# Patient Record
Sex: Male | Born: 2000 | Race: Black or African American | Hispanic: No | Marital: Single | Smoking: Never smoker
Health system: Southern US, Community
[De-identification: ages and names within clinical notes are randomized; demographics above are authoritative.]

## PROBLEM LIST (undated history)

## (undated) DIAGNOSIS — R6250 Unspecified lack of expected normal physiological development in childhood: Secondary | ICD-10-CM

## (undated) DIAGNOSIS — J45909 Unspecified asthma, uncomplicated: Secondary | ICD-10-CM

## (undated) DIAGNOSIS — L309 Dermatitis, unspecified: Secondary | ICD-10-CM

## (undated) DIAGNOSIS — E3431 Constitutional short stature: Secondary | ICD-10-CM

## (undated) DIAGNOSIS — J302 Other seasonal allergic rhinitis: Secondary | ICD-10-CM

## (undated) DIAGNOSIS — R6252 Short stature (child): Secondary | ICD-10-CM

## (undated) DIAGNOSIS — K4090 Unilateral inguinal hernia, without obstruction or gangrene, not specified as recurrent: Secondary | ICD-10-CM

## (undated) DIAGNOSIS — R011 Cardiac murmur, unspecified: Secondary | ICD-10-CM

## (undated) HISTORY — DX: Unspecified lack of expected normal physiological development in childhood: R62.50

## (undated) HISTORY — DX: Constitutional short stature: E34.31

## (undated) HISTORY — PX: ORCHIOPEXY: SHX479

---
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
## 2021-03-04 ENCOUNTER — Telehealth (INDEPENDENT_AMBULATORY_CARE_PROVIDER_SITE_OTHER): Payer: Self-pay | Admitting: Family

## 2021-03-04 NOTE — Telephone Encounter (Signed)
Who's calling (name and relationship to patient) : akhagboye Cozad mom   Best contact number: [phone]  Provider they see: Gretchen Short  Reason for call: Needs prior auth for nutropin   Call ID:      PRESCRIPTION REFILL ONLY  Name of prescription:  Pharmacy:

## 2021-03-05 NOTE — Telephone Encounter (Signed)
I will need to call [facility] for this.

## 2021-03-11 NOTE — Telephone Encounter (Signed)
Please follow up on this

## 2021-03-13 NOTE — Telephone Encounter (Signed)
Mom called this morning.She has reached out a couple of times about getting a prior auth for the patient and it hasn't been sent. Patient is out of supplies and is sharing with his twin but now he is goin to run our sooner. I see that is has been since the 20th I think. She wanted an update on it.Please Advise

## 2021-03-13 NOTE — Telephone Encounter (Signed)
PA has been sent for patient. PA submitted over the phone and chart notes sent in. Spoke with mom. She said Nettie has been using his brothers, [name]. She asked if we had any samples in office. I told her that I would ask [facility].

## 2021-03-16 NOTE — Telephone Encounter (Signed)
I received a denial for patients Nutropin. Patient doesn't meet the required criteria for medication.

## 2021-03-16 NOTE — Telephone Encounter (Signed)
Yes we can give samples if we have any in the fridge.

## 2021-03-16 NOTE — Telephone Encounter (Signed)
It should be appealed. Please speak with Marijean Niemann about this as she has dealt with this family specifically before. They usually decline then we have to do a PA.

## 2021-03-18 NOTE — Telephone Encounter (Signed)
Spoke with mom. Let her know that the [facility] was denied. I will appeal it. Also a sample is at the front for her. She is aware and will pick it up tomorrow.

## 2021-03-20 NOTE — Telephone Encounter (Signed)
Spoke with [facility]. Fax notes-labs-growth charts to show patiets height from last yr to this year. Tanner stage less than 4 Bone age is less than 16 measured in the past 12 months.   [phone]

## 2021-03-20 NOTE — Telephone Encounter (Signed)
Spoke with mom. The appeal need a current bone age scan with in a year of his last. I have asked mom if she could, go ahead and get the scan done. When I have the new scan I will send the appeal in.

## 2021-03-24 ENCOUNTER — Other Ambulatory Visit (INDEPENDENT_AMBULATORY_CARE_PROVIDER_SITE_OTHER): Payer: Self-pay

## 2021-03-24 ENCOUNTER — Ambulatory Visit
Admission: RE | Admit: 2021-03-24 | Discharge: 2021-03-24 | Disposition: A | Discharge status: Home / Self Care | Payer: 59 | Source: Ambulatory Visit | Attending: Family | Admitting: Family

## 2021-03-24 DIAGNOSIS — E230 Hypopituitarism: Secondary | ICD-10-CM

## 2021-04-03 ENCOUNTER — Other Ambulatory Visit: Payer: Self-pay

## 2021-04-03 ENCOUNTER — Encounter (INDEPENDENT_AMBULATORY_CARE_PROVIDER_SITE_OTHER): Payer: Self-pay | Admitting: Family

## 2021-04-03 ENCOUNTER — Ambulatory Visit (INDEPENDENT_AMBULATORY_CARE_PROVIDER_SITE_OTHER): Payer: 59 | Admitting: Family

## 2021-04-03 VITALS — BP 112/64 | HR 74 | Ht 64.84 in | Wt 115.2 lb

## 2021-04-03 DIAGNOSIS — M8580 Other specified disorders of bone density and structure, unspecified site: Secondary | ICD-10-CM

## 2021-04-03 DIAGNOSIS — E230 Hypopituitarism: Secondary | ICD-10-CM | POA: Diagnosis not present

## 2021-04-03 NOTE — Patient Instructions (Signed)
-   Stop growth hormone  - Repeat IGF-1 and Bp3 at 3 months and again at 6 months   It was a pleasure seeing you in clinic today. Please do not hesitate to contact me if you have questions or concerns.

## 2021-04-03 NOTE — Progress Notes (Signed)
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 [name] male   [name] was accompanied by his mother and twin brother   1. Sean [name] was seen by his PMD in 2012 for his annual well child check. The parents were concerned about poor eating, picky eating, and poor growth. They felt that Sean [name] was significantly shorter than other boys his age although he is about the same height as his twin brother. Both boys have had difficulty with getting picked on. They were seen in the fall of 2015 for their 13 year WCC. At that visit their pcp readdressed concerns about poor linear growth and advised them to have repeat bone ages and return to endocrinology. Bone ages were read as 10 years at 13 years 8 months. (Previous study 3 years ago was 7 years). This conveys a predicted adult height around 5'1".   2. His last [facility] visit was on 11/2020. In the interim, he has been generally healthy.   He will be graduating high school soon, preparing for test. He continues to take 2.1 mg of GH per day. He estimates he misses about 1 dose per week or less. HIs appetite has been good. He also takes 4 mg of cyproheptadine per day.    3. Pertinent Review of Systems:  All systems reviewed with pertinent positives listed below; otherwise negative. Constitutional: Sleeping well. 3 lbs weight loss.  Eyes; Wearing blue glasses. No changes in vision.  HENT: No neck pain. No difficulty swallowing.  Respiratory: No increased work of breathing currently Cardiac: no chest pain. No tachycardia.  GI: No constipation or diarrhea GU: puberty changes as above Musculoskeletal: No joint deformity Neuro: Normal affect Endocrine: As  above   PAST MEDICAL, FAMILY, AND SOCIAL HISTORY  Past Medical History:  Diagnosis Date  . Asthma    prn inhaler/neb.  . Constitutional growth delay   . Eczema   . Heart murmur    functional, per cardiologist 11/12/2015  . Inguinal hernia 11/2015  . Seasonal allergies   . Short stature for age   . Twin birth     Family History  Problem Relation Age of Onset  . Delayed puberty Father   . Thyroid disease Neg Hx      Current Outpatient Medications:  .  cyproheptadine (PERIACTIN) 4 MG tablet, Take 1 tablet (4 mg total) by mouth at bedtime., Disp: 30 tablet, Rfl: 5 .  NUTROPIN AQ NUSPIN 10 10 MG/2ML SOPN, INJECT 2MG  SUBCUTANEOUSLY  DAILY (DISCARD 28 DAYS  AFTER FIRST USE), Disp: 12 mL, Rfl: 1 .  albuterol (ACCUNEB) 0.63 MG/3ML nebulizer solution, Take 1 ampule by nebulization as needed. Reported on 03/31/2016 (Patient not taking: No sig reported), Disp: , Rfl:  .  albuterol (PROVENTIL HFA;VENTOLIN HFA) 108 (90 Base) MCG/ACT inhaler, Inhale into the lungs every 6 (six) hours as needed for wheezing or shortness of breath. Reported on 03/31/2016 (Patient not taking: No sig reported), Disp: , Rfl:  .  Somatropin (NUTROPIN AQ NUSPIN 10) 10 MG/2ML SOLN, INJECT 1.7MG  SUBCUTANEOUSLY 6 DAYS PER WEEK (DISCARD 28 DAYS AFTER FIRST USE) (Patient not taking: No sig reported), Disp: 8 mL, Rfl: 5  Allergies as of 04/03/2021  . (No Known  Allergies)     reports that he has never smoked. He has never used smokeless tobacco. He reports that he does not drink alcohol and does not use drugs. Pediatric History  Patient Parents  . Cashen,Akhagboye "Olu" (Mother)  . Bewley,Adegbenga N "Ade" (Father)   Other Topics Concern  . Not on file  Social History Narrative   11 th grade at [employer] Lives with parents twin brother and sister    1. School and Family: 12th grade at [employer]  2. Activities: Baseball, basketball and track  3. Primary Care Provider: Clovis Riley, L.August Saucer, MD  ROS: There are  no other significant problems involving Haidar's other body systems.    Objective:  Objective  Vital Signs:  BP 112/64 (BP Location: Right Arm, Patient Position: Sitting, Cuff Size: Normal)   Pulse 74   Ht 5' 4.84" (1.647 m)   Wt 115 lb 3.2 oz (52.3 kg)   BMI 19.26 kg/m   Blood pressure percentiles are not available for patients who are 18 years or older.  Ht Readings from Last 3 Encounters:  04/03/21 5' 4.84" (1.647 m) (5 %, Z= -1.66)*  12/04/20 5' 5.2" (1.656 m) (6 %, Z= -1.52)*  06/13/20 5' 4.53" (1.639 m) (4 %, Z= -1.72)*   * Growth percentiles are based on CDC (Boys, 2-20 Years) data.   Wt Readings from Last 3 Encounters:  04/03/21 115 lb 3.2 oz (52.3 kg) (2 %, Z= -2.06)*  12/04/20 118 lb 9.6 oz (53.8 kg) (4 %, Z= -1.76)*  06/13/20 111 lb 12.4 oz (50.7 kg) (2 %, Z= -2.14)*   * Growth percentiles are based on CDC (Boys, 2-20 Years) data.   HC Readings from Last 3 Encounters:  08/26/15 21.77" (55.3 cm) (72 %, Z= 0.59)*   * Growth percentiles are based on [facility] (Boys, 2-18 Years) data.   Body surface area is 1.55 meters squared. 5 %ile (Z= -1.66) based on CDC (Boys, 2-20 Years) Stature-for-age data based on Stature recorded on 04/03/2021. 2 %ile (Z= -2.06) based on CDC (Boys, 2-20 Years) weight-for-age data using vitals from 04/03/2021.    PHYSICAL EXAM:  General: Well developed, well nourished male in no acute distress.   Head: Normocephalic, atraumatic.   Eyes:  Pupils equal and round. EOMI.  Sclera white.  No eye drainage.   Ears/Nose/Mouth/Throat: Nares patent, no nasal drainage.  Normal dentition, mucous membranes moist.  Neck: supple, no cervical lymphadenopathy, no thyromegaly Cardiovascular: regular rate, normal S1/S2, no murmurs Respiratory: No increased work of breathing.  Lungs clear to auscultation bilaterally.  No wheezes. Abdomen: soft, nontender, nondistended. Normal bowel sounds.  No appreciable masses  Extremities: warm, well perfused, cap refill  < 2 sec.   Musculoskeletal: Normal muscle mass.  Normal strength Skin: warm, dry.  No rash or lesions. Neurologic: alert and oriented, normal speech, no tremor    LAB DATA:   No results found for this or any previous visit (from the past 672 hour(s)). pending    Assessment and Plan:  Assessment  ASSESSMENT:  Amaro is a 20 y.o. 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   Height growth has ceased which is consistent with fusion of bones on xray of hand. Will stop growth hormone injections today and monitor labs at 3 and 6 months.   PLAN:  1. Diagnostic: Reviewed bone age xray with family. Will repeat IGF 1 and BP3 in 3 months.  2. Therapeutic:Stop growth hormone.  3. Patient education: Reviewed growth chart. Discussed that  with fusion of bone on hand xray, height growth is likely complete. Will check labs at 3 months and 6 months to ensure they will not need GH supplementation as adults.   4. Follow-up: 4 months.      LOS: >30 spent today reviewing the medical chart, counseling the patient/family, and documenting today's visit.        Gretchen Short,  FNP-C  Pediatric Specialist  [address]  [place], [address]  Tele: [phone]

## 2021-04-24 ENCOUNTER — Encounter (HOSPITAL_COMMUNITY): Payer: Self-pay | Admitting: Emergency Medicine

## 2021-04-24 ENCOUNTER — Ambulatory Visit (HOSPITAL_COMMUNITY)
Admission: EM | Admit: 2021-04-24 | Discharge: 2021-04-24 | Disposition: A | Discharge status: Home / Self Care | Payer: 59 | Attending: Physician Assistant | Admitting: Physician Assistant

## 2021-04-24 DIAGNOSIS — E860 Dehydration: Secondary | ICD-10-CM | POA: Diagnosis not present

## 2021-04-24 DIAGNOSIS — R110 Nausea: Secondary | ICD-10-CM

## 2021-04-24 DIAGNOSIS — R42 Dizziness and giddiness: Secondary | ICD-10-CM

## 2021-04-24 DIAGNOSIS — R55 Syncope and collapse: Secondary | ICD-10-CM

## 2021-04-24 MED ORDER — ONDANSETRON 4 MG PO TBDP
4.0000 mg | ORAL_TABLET | Freq: Once | ORAL | Status: AC
  Administered 2021-04-24: 4 mg via ORAL

## 2021-04-24 MED ORDER — ONDANSETRON 4 MG PO TBDP: 4.0000 mg | ORAL_TABLET | Freq: Three times a day (TID) | ORAL | 0 refills | Status: DC | PRN

## 2021-04-24 MED ORDER — ONDANSETRON 4 MG PO TBDP
ORAL_TABLET | ORAL | Status: AC
  Filled 2021-04-24: qty 1

## 2021-04-24 NOTE — ED Triage Notes (Addendum)
Pt presents with dizziness, nausea, diarrhea and father states he collapsed this morning due to weakness. States Mother gave NyQuil last night. States symptoms started yesterday.   Per Verline Lema, PA okay for evaluation at [facility].

## 2021-04-24 NOTE — ED Provider Notes (Signed)
[facility]    CSN: [id number] Arrival date & time: 04/24/21  4098      History   Chief Complaint Chief Complaint  Patient presents with   Nausea   Dizziness   Near Syncope    HPI Sean [name] is a 20 y.o. male.   HPI  Weakness: Patient presents with his father and brother.  Patient has had nausea, dizziness, vomiting for the past 2 days.  He denies any vomiting and had 2 episodes of nonbloody diarrhea yesterday.  He had a near syncopal event this morning which they attribute to dehydration.  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  Due to his nausea yesterday he does not recall drinking much fluids.  He did not have anything to eat or drink this morning when he was sitting on the couch with his mom.  He got up quickly and started walking and he fell to the floor.  He remained conscious the entire time and denied any head injury.  They have given his Nyquil last night which did not help symptoms.  No known sick contacts.  Father states that they took a home COVID test this morning that was negative.  Past Medical History:  Diagnosis Date   Asthma    prn inhaler/neb.   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 Authorizing Provider  albuterol (ACCUNEB) 0.63 MG/3ML nebulizer solution Take 1 ampule by nebulization as needed. Reported on 03/31/2016 Patient not taking: No sig reported    [provider]  albuterol (PROVENTIL HFA;VENTOLIN HFA) 108 (90 Base) MCG/ACT inhaler Inhale into the lungs every 6 (six) hours as needed for wheezing or shortness of breath. Reported on 03/31/2016 Patient not taking: No sig reported    [provider]  cyproheptadine (PERIACTIN) 4 MG tablet Take 1 tablet (4 mg total) by mouth at bedtime. 08/14/20   Gretchen Short, NP  NUTROPIN AQ NUSPIN 10 10 MG/2ML SOPN INJECT 2MG  SUBCUTANEOUSLY  DAILY (DISCARD 28 DAYS  AFTER FIRST USE) 11/24/20   01/22/21, NP  Somatropin (NUTROPIN AQ NUSPIN 10) 10 MG/2ML SOLN INJECT 1.7MG  SUBCUTANEOUSLY 6 DAYS PER WEEK (DISCARD 28 DAYS AFTER FIRST USE) Patient not taking: No sig reported 07/30/19   08/01/19, NP    Family History Family History  Problem Relation Age of Onset   Delayed puberty Father    Thyroid disease Neg Hx     Social History Social History   Tobacco Use   Smoking status: Never   Smokeless tobacco: Never  Substance Use Topics   Alcohol  use: No   Drug use: No     Allergies   Patient has no known allergies.   Review of Systems Review of Systems  As stated above in HPI Physical Exam Triage Vital Signs ED Triage Vitals  Enc Vitals Group     BP 04/24/21 0900 (!) 98/34     Pulse Rate 04/24/21 0900 87     Resp 04/24/21 0900 18     Temp 04/24/21 0900 98.5 F (36.9 C)     Temp Source 04/24/21 0900 Oral     SpO2 04/24/21 0900 100 %     Weight --      Height --      Head Circumference --      Peak Flow --      Pain Score 04/24/21 0857 0     Pain Loc --      Pain Edu? --      Excl. in GC? --    No data found.  Updated Vital Signs BP 112/60   Pulse 91   Temp 98.5 F (36.9 C) (Oral)   Resp 18   SpO2 100%    Physical Exam Vitals and nursing note reviewed.  Constitutional:      General: He is not in acute distress.    Appearance: Normal appearance. He is not ill-appearing, toxic-appearing or diaphoretic.  HENT:     Head: Normocephalic and atraumatic.     Right Ear: Tympanic membrane normal.     Left Ear: Tympanic membrane normal.     Nose: Nose normal. No congestion or rhinorrhea.     Mouth/Throat:     Mouth: Mucous membranes are dry.  Eyes:     Extraocular Movements: Extraocular movements intact.     Pupils: Pupils are equal, round, and reactive to light.  Neck:     Vascular: No carotid bruit.  Cardiovascular:     Rate and Rhythm: Normal rate and regular rhythm.     Heart sounds: Normal heart sounds.  Pulmonary:     Effort: Pulmonary effort is normal.     Breath sounds: Normal breath sounds.  Musculoskeletal:     Cervical back: Neck supple.  Lymphadenopathy:     Cervical: No cervical adenopathy.  Skin:    General: Skin is warm.  Neurological:     General: No focal deficit present.     Mental Status: He is alert and oriented to person, place, and time.     Cranial Nerves: No cranial nerve deficit.     Sensory: No sensory deficit.     Motor: No weakness.     Coordination: Coordination normal.     Gait: Gait normal.     Deep Tendon Reflexes: Reflexes normal.  Psychiatric:        Mood and Affect: Mood normal.        Behavior: Behavior normal.        Thought Content: Thought content normal.        Judgment: Judgment normal.     UC Treatments / Results  Labs (all labs ordered are listed, but only abnormal results are displayed) Labs Reviewed - No data to display  EKG   Radiology No results found.  Procedures Procedures (including critical care time)  Medications Ordered in UC Medications  ondansetron (ZOFRAN-ODT) disintegrating tablet 4 mg (4 mg Oral Given 04/24/21 0905)    Initial Impression / Assessment and Plan / UC Course  I have reviewed the triage  vital signs and the nursing notes.  Pertinent labs & imaging results that were available during my care of the patient were reviewed by me and considered in my medical decision making (see chart for details).     New.  Patient was given Zofran and a trial of water which he has been able to hold down.  He states he feels significantly improved since his nausea has been under control and he has been able to orally hydrate here in office.  This is extremely reassuring along with his significant improvement in vitals.  At this time they have elected to have him discharged home with Zofran with a goal of oral hydration with water and electrolyte drink along with healthy regular meals, close monitoring and avoidance of any stressors or heat.  Work note given for today and tomorrow.  We discussed red flag signs and symptoms and if he does not continue to improve at home that I would recommend that he be evaluated further in the emergency department. Final Clinical Impressions(s) / UC Diagnoses   Final diagnoses:  None   Discharge Instructions   None    ED Prescriptions   None    PDMP not reviewed this encounter.   Rushie Chestnut, [place] 04/24/21 1030

## 2021-04-26 ENCOUNTER — Other Ambulatory Visit: Payer: Self-pay

## 2021-04-26 ENCOUNTER — Inpatient Hospital Stay (HOSPITAL_COMMUNITY)
Admission: EM | Admit: 2021-04-26 | Discharge: 2021-05-01 | DRG: 812 | Disposition: A | Discharge status: Home / Self Care | Payer: 59 | Attending: Family Medicine | Admitting: Family Medicine

## 2021-04-26 DIAGNOSIS — R55 Syncope and collapse: Secondary | ICD-10-CM | POA: Diagnosis not present

## 2021-04-26 DIAGNOSIS — R011 Cardiac murmur, unspecified: Secondary | ICD-10-CM | POA: Diagnosis present

## 2021-04-26 DIAGNOSIS — D649 Anemia, unspecified: Secondary | ICD-10-CM | POA: Diagnosis not present

## 2021-04-26 DIAGNOSIS — Q5310 Unspecified undescended testicle, unilateral: Secondary | ICD-10-CM

## 2021-04-26 DIAGNOSIS — Z68.41 Body mass index (BMI) pediatric, 85th percentile to less than 95th percentile for age: Secondary | ICD-10-CM

## 2021-04-26 DIAGNOSIS — E441 Mild protein-calorie malnutrition: Secondary | ICD-10-CM | POA: Diagnosis present

## 2021-04-26 DIAGNOSIS — D509 Iron deficiency anemia, unspecified: Principal | ICD-10-CM | POA: Diagnosis present

## 2021-04-26 DIAGNOSIS — Z20822 Contact with and (suspected) exposure to covid-19: Secondary | ICD-10-CM | POA: Diagnosis present

## 2021-04-26 DIAGNOSIS — D696 Thrombocytopenia, unspecified: Secondary | ICD-10-CM | POA: Diagnosis present

## 2021-04-26 DIAGNOSIS — E230 Hypopituitarism: Secondary | ICD-10-CM | POA: Diagnosis present

## 2021-04-26 DIAGNOSIS — Z79899 Other long term (current) drug therapy: Secondary | ICD-10-CM

## 2021-04-26 LAB — CBC WITH DIFFERENTIAL/PLATELET
Abs Immature Granulocytes: 0.06 10*3/uL (ref 0.00–0.07)
Basophils Absolute: 0 10*3/uL (ref 0.0–0.1)
Basophils Relative: 0 %
Eosinophils Absolute: 0.2 10*3/uL (ref 0.0–0.5)
Eosinophils Relative: 2 %
HCT: 15.8 % — ABNORMAL LOW (ref 39.0–52.0)
Hemoglobin: 4.9 g/dL — CL (ref 13.0–17.0)
Immature Granulocytes: 1 %
Lymphocytes Relative: 26 %
Lymphs Abs: 2.6 10*3/uL (ref 0.7–4.0)
MCH: 23.8 pg — ABNORMAL LOW (ref 26.0–34.0)
MCHC: 31 g/dL (ref 30.0–36.0)
MCV: 76.7 fL — ABNORMAL LOW (ref 80.0–100.0)
Monocytes Absolute: 1 10*3/uL (ref 0.1–1.0)
Monocytes Relative: 10 %
Neutro Abs: 6.2 10*3/uL (ref 1.7–7.7)
Neutrophils Relative %: 61 %
Platelets: 157 10*3/uL (ref 150–400)
RBC: 2.06 MIL/uL — ABNORMAL LOW (ref 4.22–5.81)
RDW: 15.2 % (ref 11.5–15.5)
WBC: 9.9 10*3/uL (ref 4.0–10.5)
nRBC: 0 % (ref 0.0–0.2)

## 2021-04-26 LAB — PREPARE RBC (CROSSMATCH)

## 2021-04-26 LAB — RETICULOCYTES
Immature Retic Fract: 44.1 % — ABNORMAL HIGH (ref 2.3–15.9)
RBC.: 2.02 MIL/uL — ABNORMAL LOW (ref 4.22–5.81)
Retic Count, Absolute: 80.2 10*3/uL (ref 19.0–186.0)
Retic Ct Pct: 4 % — ABNORMAL HIGH (ref 0.4–3.1)

## 2021-04-26 LAB — IRON AND TIBC
Iron: 82 ug/dL (ref 45–182)
Saturation Ratios: 33 % (ref 17.9–39.5)
TIBC: 251 ug/dL (ref 250–450)
UIBC: 169 ug/dL

## 2021-04-26 LAB — PROTIME-INR
INR: 1.2 (ref 0.8–1.2)
Prothrombin Time: 14.9 seconds (ref 11.4–15.2)

## 2021-04-26 LAB — COMPREHENSIVE METABOLIC PANEL
ALT: 12 U/L (ref 0–44)
AST: 18 U/L (ref 15–41)
Albumin: 3.2 g/dL — ABNORMAL LOW (ref 3.5–5.0)
Alkaline Phosphatase: 56 U/L (ref 38–126)
Anion gap: 4 — ABNORMAL LOW (ref 5–15)
BUN: 26 mg/dL — ABNORMAL HIGH (ref 6–20)
CO2: 26 mmol/L (ref 22–32)
Calcium: 8.4 mg/dL — ABNORMAL LOW (ref 8.9–10.3)
Chloride: 106 mmol/L (ref 98–111)
Creatinine, Ser: 0.75 mg/dL (ref 0.61–1.24)
GFR, Estimated: >60 mL/min (ref >60)
Glucose, Bld: 134 mg/dL — ABNORMAL HIGH (ref 70–99)
Potassium: 4.1 mmol/L (ref 3.5–5.1)
Sodium: 136 mmol/L (ref 135–145)
Total Bilirubin: 0.2 mg/dL — ABNORMAL LOW (ref 0.3–1.2)
Total Protein: 5.5 g/dL — ABNORMAL LOW (ref 6.5–8.1)

## 2021-04-26 LAB — FERRITIN: Ferritin: 16 ng/mL — ABNORMAL LOW (ref 24–336)

## 2021-04-26 LAB — POC OCCULT BLOOD, ED: Fecal Occult Bld: NEGATIVE

## 2021-04-26 LAB — RESP PANEL BY RT-PCR (FLU A&B, COVID) ARPGX2
Influenza A by PCR: NEGATIVE
Influenza B by PCR: NEGATIVE
SARS Coronavirus 2 by RT PCR: NEGATIVE

## 2021-04-26 LAB — FOLATE: Folate: 8.4 ng/mL (ref >5.9)

## 2021-04-26 LAB — LIPASE, BLOOD: Lipase: 29 U/L (ref 11–51)

## 2021-04-26 LAB — VITAMIN B12: Vitamin B-12: 367 pg/mL (ref 180–914)

## 2021-04-26 MED ORDER — ACETAMINOPHEN 650 MG RE SUPP: 650.0000 mg | Freq: Four times a day (QID) | RECTAL | Status: DC | PRN

## 2021-04-26 MED ORDER — ONDANSETRON HCL 4 MG PO TABS: 4.0000 mg | ORAL_TABLET | Freq: Four times a day (QID) | ORAL | Status: DC | PRN

## 2021-04-26 MED ORDER — ALBUTEROL SULFATE (2.5 MG/3ML) 0.083% IN NEBU: 2.5000 mg | INHALATION_SOLUTION | Freq: Three times a day (TID) | RESPIRATORY_TRACT | Status: DC | PRN

## 2021-04-26 MED ORDER — SODIUM CHLORIDE 0.9% IV SOLUTION: Freq: Once | INTRAVENOUS | Status: AC

## 2021-04-26 MED ORDER — OXYCODONE HCL 5 MG PO TABS
5.0000 mg | ORAL_TABLET | ORAL | Status: DC | PRN
Start: 2021-04-26 — End: 2021-05-01

## 2021-04-26 MED ORDER — ONDANSETRON HCL 4 MG/2ML IJ SOLN
4.0000 mg | Freq: Four times a day (QID) | INTRAMUSCULAR | Status: DC | PRN
  Filled 2021-04-26: qty 2

## 2021-04-26 MED ORDER — ACETAMINOPHEN 325 MG PO TABS
650.0000 mg | ORAL_TABLET | Freq: Four times a day (QID) | ORAL | Status: DC | PRN
  Administered 2021-04-30: 650 mg via ORAL
  Filled 2021-04-26: qty 2

## 2021-04-26 MED ORDER — FERROUS SULFATE 325 (65 FE) MG PO TABS
325.0000 mg | ORAL_TABLET | Freq: Once | ORAL | Status: AC
  Administered 2021-04-26: 325 mg via ORAL
  Filled 2021-04-26: qty 1

## 2021-04-26 MED ORDER — ALBUTEROL SULFATE 0.63 MG/3ML IN NEBU: 0.6300 mg | INHALATION_SOLUTION | Freq: Three times a day (TID) | RESPIRATORY_TRACT | Status: DC | PRN

## 2021-04-26 NOTE — H&P (Signed)
TRH H&P    Patient Demographics:    Sean [name], is a 20 y.o. male  MRN: [id number]  DOB - [birth year]  Admit Date - 04/26/2021  Referring MD/NP/PA: Tegeler  Outpatient Primary MD for the patient is Clovis Riley, L.August Saucer, MD  Patient coming from: Home  Chief complaint- near syncope   HPI:    Sean [name]  is a 20 y.o. male, with history of asthma, constitutional growth delay, growth hormone deficiency, short stature for age, presents the ED for chief complaint of near syncope.  Mother is at bedside and patient request that I take history from mother.  She reports that 3 days ago patient started complaining of weakness, and feeling lightheaded.  She thought he was pale at that time.  She reports that he had 1 near syncopal episode at that time where he hit his head on the wall but did not lose consciousness.  They went to urgent care and he was noted to be hypotensive, given fluids and Zofran, blood pressure normalized, he was sent home.  He reports that over the weekend he has had a decrease in appetite, decrease in level of activity.  She is also noted he has worsening calor.  She reports that she gave him an iron pill because he looks so pale.  He is not usually on iron.  He reports at 1:30 PM on the day of presentation patient was weak and fatigued, felt warm so she given Tylenol and has been taking that.  When she went to wake him up to come drink fluid he said he was too dizzy to get up and she determined that they should bring him into the ED.  He did not have a near syncopal episode today.  He has not complained of shortness of breath.  He notices no melena, hematochezia, hematuria, bleeding from the gums, epistaxis.  There is no family history of sickle cell.  Patient was on growth hormone and was stopped about a month ago.  Patient does not smoke, does not drink, does not use illicit drugs.  He is vaccinated for  COVID.  Patient is full code  In the ED Temp 98.8, heart rate 90-105, respiratory rate 15-21, blood pressure 107/53, satting at 100% No leukocytosis with a white blood cell count of 9.9, hemoglobin 4.9, platelets 157 Chemistry panel is unremarkable Albumin 3.2 Negative FOBT Anemia panel ordered 2 units ordered of packed red blood cells Admission for further work-up of acute anemia    Review of systems:    In addition to the HPI above,  No Fever-chills, No Headache, No changes with Vision or hearing, No problems swallowing food or Liquids, No Chest pain, Cough or Shortness of Breath, No Abdominal pain, No Nausea or Vomiting, bowel movements are regular, No Blood in stool or Urine, No dysuria, No new skin rashes or bruises, No new joints pains-aches,  No new weakness, tingling, numbness in any extremity, No recent weight gain or loss, No polyuria, polydypsia or polyphagia, No significant Mental Stressors.  All other systems reviewed and are  negative.    Past History of the following :    Past Medical History:  Diagnosis Date   Asthma    prn inhaler/neb.   Constitutional growth delay    Eczema    Heart murmur    functional, per cardiologist 11/12/2015   Inguinal hernia 11/2015   Seasonal allergies    Short stature for age    Twin birth       Past Surgical History:  Procedure Laterality Date   INGUINAL HERNIA REPAIR Right 11/27/2015   Procedure: HERNIA REPAIR INGUINAL PEDIATRIC;  Surgeon: Leonia CoronaShuaib Farooqui, MD;  Location: [facility];  Service: Pediatrics;  Laterality: Right;   ORCHIOPEXY     age 61      Social History:      Social History   Tobacco Use   Smoking status: Never   Smokeless tobacco: Never  Substance Use Topics   Alcohol use: No       Family History :     Family History  Problem Relation Age of Onset   Delayed puberty Father    Thyroid disease Neg Hx       Home Medications:   Prior to Admission medications    Medication Sig Start Date End Date Taking? Authorizing Provider  acetaminophen (TYLENOL) 500 MG tablet Take 500 mg by mouth every 6 (six) hours as needed.   Yes [provider]  albuterol (ACCUNEB) 0.63 MG/3ML nebulizer solution Take 1 ampule by nebulization as needed. Reported on 03/31/2016   Yes [provider]  albuterol (PROVENTIL HFA;VENTOLIN HFA) 108 (90 Base) MCG/ACT inhaler Inhale into the lungs every 6 (six) hours as needed for wheezing or shortness of breath. Reported on 03/31/2016   Yes [provider]  ferrous sulfate 325 (65 FE) MG tablet Take 325 mg by mouth once.   Yes [provider]  ondansetron (ZOFRAN ODT) 4 MG disintegrating tablet Take 1 tablet (4 mg total) by mouth every 8 (eight) hours as needed for nausea or vomiting. 04/24/21  Yes Covington, Sarah M, PA-C  cyproheptadine (PERIACTIN) 4 MG tablet Take 1 tablet (4 mg total) by mouth at bedtime. Patient not taking: No sig reported 08/14/20   Gretchen ShortBeasley, Spenser, NP  NUTROPIN AQ NUSPIN 10 10 MG/2ML SOPN INJECT 2MG  SUBCUTANEOUSLY  DAILY (DISCARD 28 DAYS  AFTER FIRST USE) Patient not taking: Reported on 04/26/2021 11/24/20   Gretchen ShortBeasley, Spenser, NP  Somatropin (NUTROPIN AQ NUSPIN 10) 10 MG/2ML SOLN INJECT 1.7MG  SUBCUTANEOUSLY 6 DAYS PER WEEK (DISCARD 28 DAYS AFTER FIRST USE) Patient not taking: No sig reported 07/30/19   Gretchen ShortBeasley, Spenser, NP     Allergies:    No Known Allergies   Physical Exam:   Vitals  Blood pressure (!) 107/53, pulse 90, temperature 98.8 F (37.1 C), temperature source Oral, resp. rate (!) 21, height 5\' 4"  (1.626 m), weight 52.2 kg, SpO2 100 %.  1.  General: Patient lying supine in bed,  no acute distress   2. Psychiatric: Alert and oriented x 3, mood and behavior normal for situation, pleasant and cooperative with exam   3. Neurologic: Speech and language are normal, face is symmetric, moves all 4 extremities voluntarily, at baseline without acute deficits on limited  exam   4. HEENMT:  Head is atraumatic, normocephalic, pupils reactive to light, neck is supple, trachea is midline, mucous membranes are moist   5. Respiratory : Lungs are clear to auscultation bilaterally without wheezing, rhonchi, rales, no cyanosis, no increase in work of breathing or  accessory muscle use   6. Cardiovascular : Heart rate normal, rhythm is regular, no rubs or gallops, no peripheral edema, peripheral pulses palpated   7. Gastrointestinal:  Abdomen is soft, nondistended, nontender to palpation bowel sounds active, no masses or organomegaly palpated   8. Skin:  Skin is warm, dry and intact without rashes, acute lesions, or ulcers on limited exam   9.Musculoskeletal:  No acute deformities or trauma, no asymmetry in tone, no peripheral edema, peripheral pulses palpated, no tenderness to palpation in the extremities    Data Review:    CBC Recent Labs  Lab 04/26/21 2005  WBC 9.9  HGB 4.9*  HCT 15.8*  PLT 157  MCV 76.7*  MCH 23.8*  MCHC 31.0  RDW 15.2  LYMPHSABS 2.6  MONOABS 1.0  EOSABS 0.2  BASOSABS 0.0   ------------------------------------------------------------------------------------------------------------------  Results for orders placed or performed during the hospital encounter of 04/26/21 (from the past 48 hour(s))  Comprehensive metabolic panel     Status: Abnormal   Collection Time: 04/26/21  8:05 PM  Result Value Ref Range   Sodium 136 135 - 145 mmol/L   Potassium 4.1 3.5 - 5.1 mmol/L   Chloride 106 98 - 111 mmol/L   CO2 26 22 - 32 mmol/L   Glucose, Bld 134 (H) 70 - 99 mg/dL    Comment: Glucose reference range applies only to samples taken after fasting for at least 8 hours.   BUN 26 (H) 6 - 20 mg/dL   Creatinine, Ser 3.42 0.61 - 1.24 mg/dL   Calcium 8.4 (L) 8.9 - 10.3 mg/dL   Total Protein 5.5 (L) 6.5 - 8.1 g/dL   Albumin 3.2 (L) 3.5 - 5.0 g/dL   AST 18 15 - 41 U/L   ALT 12 0 - 44 U/L   Alkaline Phosphatase 56 38 - 126 U/L   Total  Bilirubin 0.2 (L) 0.3 - 1.2 mg/dL   GFR, Estimated >87 >68 mL/min    Comment: (NOTE) Calculated using the CKD-EPI Creatinine Equation (2021)    Anion gap 4 (L) 5 - 15    Comment: Performed at [facility], [address], [place], [place] [address]  CBC with Differential     Status: Abnormal   Collection Time: 04/26/21  8:05 PM  Result Value Ref Range   WBC 9.9 4.0 - 10.5 K/uL   RBC 2.06 (L) 4.22 - 5.81 MIL/uL   Hemoglobin 4.9 (LL) 13.0 - 17.0 g/dL    Comment: Reticulocyte Hemoglobin testing may be clinically indicated, consider ordering this additional test [id number] THIS CRITICAL RESULT HAS VERIFIED AND BEEN CALLED TO [facility],S BY KENNEDY JACKSON ON 06 12 2022 AT 2057, AND HAS BEEN READ BACK. CRITICAL RESULT VERIFIED    HCT 15.8 (L) 39.0 - 52.0 %   MCV 76.7 (L) 80.0 - 100.0 fL   MCH 23.8 (L) 26.0 - 34.0 pg   MCHC 31.0 30.0 - 36.0 g/dL   RDW 41.6 38.4 - 53.6 %   Platelets 157 150 - 400 K/uL   nRBC 0.0 0.0 - 0.2 %   Neutrophils Relative % 61 %   Neutro Abs 6.2 1.7 - 7.7 K/uL   Lymphocytes Relative 26 %   Lymphs Abs 2.6 0.7 - 4.0 K/uL   Monocytes Relative 10 %   Monocytes Absolute 1.0 0.1 - 1.0 K/uL   Eosinophils Relative 2 %   Eosinophils Absolute 0.2 0.0 - 0.5 K/uL   Basophils Relative 0 %   Basophils Absolute 0.0 0.0 -  0.1 K/uL   Immature Granulocytes 1 %   Abs Immature Granulocytes 0.06 0.00 - 0.07 K/uL    Comment: Performed at [facility], [address] [place], [place] [address]  ABO/Rh     Status: None (Preliminary result)   Collection Time: 04/26/21  8:05 PM  Result Value Ref Range   ABO/RH(D)      O POS Performed at [facility], [address] [place], [place] [address]   Type and screen [facility]     Status: None (Preliminary result)   Collection Time: 04/26/21  9:20 PM  Result Value Ref Range   ABO/RH(D) O POS    Antibody Screen NEG    Sample Expiration 04/29/2021,2359     Unit Number [id number]    Blood Component Type RED CELLS,LR    Unit division 00    Status of Unit ALLOCATED    Transfusion Status OK TO TRANSFUSE    Crossmatch Result      Compatible Performed at [facility], [address] [place], [place] [address]    Unit Number [id number]    Blood Component Type RED CELLS,LR    Unit division 00    Status of Unit ALLOCATED    Transfusion Status OK TO TRANSFUSE    Crossmatch Result Compatible   Reticulocytes     Status: Abnormal   Collection Time: 04/26/21  9:20 PM  Result Value Ref Range   Retic Ct Pct 4.0 (H) 0.4 - 3.1 %   RBC. 2.02 (L) 4.22 - 5.81 MIL/uL   Retic Count, Absolute 80.2 19.0 - 186.0 K/uL   Immature Retic Fract 44.1 (H) 2.3 - 15.9 %    Comment: Performed at [facility], [address], [place], [place] [address]  Prepare RBC (crossmatch)     Status: None   Collection Time: 04/26/21  9:20 PM  Result Value Ref Range   Order Confirmation      ORDER PROCESSED BY BLOOD BANK Performed at [facility], [address], [place], [place] [address]   POC occult blood, ED Provider will collect     Status: None   Collection Time: 04/26/21  9:29 PM  Result Value Ref Range   Fecal Occult Bld NEGATIVE NEGATIVE  Lipase, blood     Status: None   Collection Time: 04/26/21  9:48 PM  Result Value Ref Range   Lipase 29 11 - 51 U/L    Comment: Performed at [facility], [address] [place], [place] [address]  Protime-INR     Status: None   Collection Time: 04/26/21  9:48 PM  Result Value Ref Range   Prothrombin Time 14.9 11.4 - 15.2 seconds   INR 1.2 0.8 - 1.2    Comment: (NOTE) INR goal varies based on device and disease states. Performed at [facility], [address], [place], [place] [address]     Chemistries  Recent Labs  Lab 04/26/21 2005  NA 136  K 4.1  CL 106  CO2 26  GLUCOSE 134*  BUN 26*  CREATININE 0.75   CALCIUM 8.4*  AST 18  ALT 12  ALKPHOS 56  BILITOT 0.2*   ------------------------------------------------------------------------------------------------------------------  ------------------------------------------------------------------------------------------------------------------ GFR: Estimated Creatinine Clearance: 109.7 mL/min (by C-G formula based on SCr of 0.75 mg/dL). Liver Function Tests: Recent Labs  Lab 04/26/21 2005  AST 18  ALT 12  ALKPHOS 56  BILITOT 0.2*  PROT 5.5*  ALBUMIN  3.2*   Recent Labs  Lab 04/26/21 2148  LIPASE 29   No results for input(s): AMMONIA in the last 168 hours. Coagulation Profile: Recent Labs  Lab 04/26/21 2148  INR 1.2   Cardiac Enzymes: No results for input(s): CKTOTAL, CKMB, CKMBINDEX, TROPONINI in the last 168 hours. BNP (last 3 results) No results for input(s): PROBNP in the last 8760 hours. HbA1C: No results for input(s): HGBA1C in the last 72 hours. CBG: No results for input(s): GLUCAP in the last 168 hours. Lipid Profile: No results for input(s): CHOL, HDL, LDLCALC, TRIG, CHOLHDL, LDLDIRECT in the last 72 hours. Thyroid Function Tests: No results for input(s): TSH, T4TOTAL, FREET4, T3FREE, THYROIDAB in the last 72 hours. Anemia Panel: Recent Labs    04/26/21 2120  RETICCTPCT 4.0*    --------------------------------------------------------------------------------------------------------------- Urine analysis: No results found for: COLORURINE, APPEARANCEUR, LABSPEC, PHURINE, GLUCOSEU, HGBUR, BILIRUBINUR, KETONESUR, PROTEINUR, UROBILINOGEN, NITRITE, LEUKOCYTESUR    Imaging Results:    No results found.   Assessment & Plan:    Principal Problem:   Symptomatic anemia Active Problems:   Growth hormone deficiency (HCC)   Mild protein-calorie malnutrition (HCC)   Near syncope   Near syncope Collapsed, no LOC 2/2 to below Treat as below Orthostatics at admission Acute symptomatic anemia Hgb  4.9 Tachycardic, exertional dyspnea FOBT negative Typed, screen, crossmatch 2 units Mild protein cal mal Albumin 3.2 Advised Nutrient dense food choices Growth Hormone deficiency Could be related to anemia, recently stopped somatropin last month   DVT Prophylaxis-   SCDs   AM Labs Ordered, also please review Full Orders  Family Communication: Admission, patients condition and plan of care including tests being ordered have been discussed with the patient and mother who indicate understanding and agree with the plan and Code Status.  Code Status: Full  Admission status: Observation Time spent in minutes : 65   Amala Petion B Zierle-Ghosh DO

## 2021-04-26 NOTE — ED Provider Notes (Signed)
[facility]-EMERGENCY DEPT Provider Note   CSN: [id number] Arrival date & time: 04/26/21  1953     History Chief Complaint  Patient presents with   Weakness    Sean [name] is a 20 y.o. male.  The history is provided by the patient and a parent. No language interpreter was used.  Weakness Severity:  Moderate Onset quality:  Gradual Duration:  3 days Timing:  Constant Progression:  Waxing and waning Chronicity:  New Relieved by:  Lying down Worsened by:  Standing Ineffective treatments:  None tried Associated symptoms: nausea, near-syncope and shortness of breath (with exertion)   Associated symptoms: no abdominal pain, no aphasia, no chest pain, no cough, no diarrhea, no dizziness, no numbness in extremities, no fever, no headaches, no loss of consciousness, no melena, no myalgias, no seizures, no syncope, no vision change and no vomiting       Past Medical History:  Diagnosis Date   Asthma    prn inhaler/neb.   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   Delayed bone age 108/19/2016    Past Surgical History:  Procedure Laterality Date   INGUINAL HERNIA REPAIR Right 11/27/2015   Procedure: HERNIA REPAIR INGUINAL PEDIATRIC;  Surgeon: Leonia CoronaShuaib Farooqui, MD;  Location: [facility];  Service: Pediatrics;  Laterality: Right;   ORCHIOPEXY     age 89       Family History  Problem Relation Age of Onset   Delayed puberty Father    Thyroid disease Neg Hx     Social History   Tobacco Use   Smoking status: Never   Smokeless tobacco: Never  Substance Use Topics   Alcohol use: No    Drug use: No    Home Medications Prior to Admission medications   Medication Sig Start Date End Date Taking? Authorizing Provider  acetaminophen (TYLENOL) 500 MG tablet Take 500 mg by mouth every 6 (six) hours as needed.   Yes [provider]  albuterol (ACCUNEB) 0.63 MG/3ML nebulizer solution Take 1 ampule by nebulization as needed. Reported on 03/31/2016   Yes [provider]  albuterol (PROVENTIL HFA;VENTOLIN HFA) 108 (90 Base) MCG/ACT inhaler Inhale into the lungs every 6 (six) hours as needed for wheezing or shortness of breath. Reported on 03/31/2016   Yes [provider]  ferrous sulfate 325 (65 FE) MG tablet Take 325 mg by mouth once.   Yes [provider]  ondansetron (ZOFRAN ODT) 4 MG disintegrating tablet Take 1 tablet (4 mg total) by mouth every 8 (eight) hours as needed for nausea or vomiting. 04/24/21  Yes Covington, Sarah M, PA-C  cyproheptadine (PERIACTIN) 4 MG tablet Take 1 tablet (4 mg total) by mouth at bedtime. Patient not taking: No sig reported 08/14/20   Gretchen ShortBeasley, Spenser, NP  NUTROPIN AQ NUSPIN 10 10 MG/2ML SOPN INJECT 2MG  SUBCUTANEOUSLY  DAILY (DISCARD 28 DAYS  AFTER FIRST USE) Patient not taking: Reported on 04/26/2021 11/24/20   Gretchen ShortBeasley, Spenser, NP  Somatropin (NUTROPIN AQ NUSPIN 10) 10 MG/2ML SOLN INJECT 1.7MG  SUBCUTANEOUSLY 6 DAYS PER WEEK (DISCARD 28 DAYS AFTER  FIRST USE) Patient not taking: No sig reported 07/30/19   Gretchen Short, NP    Allergies    Patient has no known allergies.  Review of Systems   Review of Systems  Constitutional:  Positive for fatigue. Negative for chills, diaphoresis and fever.  HENT:  Negative for congestion and rhinorrhea.   Respiratory:  Positive for shortness of breath (with exertion). Negative for cough and chest tightness.   Cardiovascular:  Positive for near-syncope. Negative for chest pain and syncope.  Gastrointestinal:  Positive for nausea. Negative for abdominal pain, constipation, diarrhea,  melena and vomiting.  Musculoskeletal:  Negative for back pain and myalgias.  Skin:  Negative for rash and wound.  Neurological:  Positive for weakness and light-headedness. Negative for dizziness, seizures, loss of consciousness and headaches.  Psychiatric/Behavioral:  Negative for agitation and confusion.   All other systems reviewed and are negative.  Physical Exam Updated Vital Signs BP (!) 111/57 (BP Location: Right Arm)   Pulse (!) 105   Temp 98.8 F (37.1 C) (Oral) Comment: tylenol @ 14:30  Resp 15   Ht 5\' 4"  (1.626 m)   Wt 52.2 kg   SpO2 100%   BMI 19.74 kg/m   Physical Exam Vitals and nursing note reviewed.  Constitutional:      General: He is not in acute distress.    Appearance: He is well-developed. He is not ill-appearing, toxic-appearing or diaphoretic.  HENT:     Head: Normocephalic and atraumatic.     Nose: No congestion or rhinorrhea.     Mouth/Throat:     Mouth: Mucous membranes are moist.     Pharynx: No oropharyngeal exudate or posterior oropharyngeal erythema.  Eyes:     Extraocular Movements: Extraocular movements intact.     Conjunctiva/sclera: Conjunctivae normal.     Pupils: Pupils are equal, round, and reactive to light.  Cardiovascular:     Rate and Rhythm: Normal rate and regular rhythm.     Heart sounds: No murmur heard. Pulmonary:     Effort: Pulmonary effort is normal. No respiratory distress.     Breath sounds: Normal breath sounds. No wheezing, rhonchi or rales.  Chest:     Chest wall: No tenderness.  Abdominal:     Palpations: Abdomen is soft.     Tenderness: There is no abdominal tenderness. There is no right CVA tenderness, left CVA tenderness, guarding or rebound.  Musculoskeletal:        General: No tenderness.     Cervical back: Neck supple. No tenderness.     Right lower leg: No edema.     Left lower leg: No edema.  Skin:    General: Skin is warm and dry.     Capillary Refill: Capillary refill takes less than 2 seconds.      Coloration: Skin is pale.  Neurological:     General: No focal deficit present.     Mental Status: He is alert.     Sensory: No sensory deficit.     Motor: No weakness.  Psychiatric:        Mood and Affect: Mood normal.    ED Results / Procedures / Treatments   Labs (all labs ordered are listed, but only abnormal results are displayed) Labs Reviewed  COMPREHENSIVE METABOLIC PANEL - Abnormal; Notable for the following components:      Result Value   Glucose, Bld 134 (*)    BUN 26 (*)    Calcium 8.4 (*)    Total Protein 5.5 (*)  Albumin 3.2 (*)    Total Bilirubin 0.2 (*)    Anion gap 4 (*)    All other components within normal limits  CBC WITH DIFFERENTIAL/PLATELET - Abnormal; Notable for the following components:   RBC 2.06 (*)    Hemoglobin 4.9 (*)    HCT 15.8 (*)    MCV 76.7 (*)    MCH 23.8 (*)    All other components within normal limits  FERRITIN - Abnormal; Notable for the following components:   Ferritin 16 (*)    All other components within normal limits  RETICULOCYTES - Abnormal; Notable for the following components:   Retic Ct Pct 4.0 (*)    RBC. 2.02 (*)    Immature Retic Fract 44.1 (*)    All other components within normal limits  RESP PANEL BY RT-PCR (FLU A&B, COVID) ARPGX2  LIPASE, BLOOD  VITAMIN B12  FOLATE  IRON AND TIBC  PROTIME-INR  HIV ANTIBODY (ROUTINE TESTING W REFLEX)  COMPREHENSIVE METABOLIC PANEL  MAGNESIUM  TSH  PROTIME-INR  POC OCCULT BLOOD, ED  TYPE AND SCREEN  PREPARE RBC (CROSSMATCH)  ABO/RH    EKG EKG Interpretation  Date/Time:  Sunday April 26 2021 20:17:32 EDT Ventricular Rate:  100 PR Interval:  180 QRS Duration: 72 QT Interval:  346 QTC Calculation: 447 R Axis:   65 Text Interpretation: Sinus tachycardia 12 Lead; Mason[place]Likar when compared to prior, previosu t wave inversions are improved. NO STEMI Confirmed by Theda Belfast ([id number]) on 04/26/2021 8:59:44 PM  Radiology No results found.  Procedures Procedures    CRITICAL CARE Performed by: Canary Brim Charvez Voorhies Total critical care time: 45 minutes Critical care time was exclusive of separately billable procedures and treating other patients. Critical care was necessary to treat or prevent imminent or life-threatening deterioration. Critical care was time spent personally by me on the following activities: development of treatment plan with patient and/or surrogate as well as nursing, discussions with consultants, evaluation of patient's response to treatment, examination of patient, obtaining history from patient or surrogate, ordering and performing treatments and interventions, ordering and review of laboratory studies, ordering and review of radiographic studies, pulse oximetry and re-evaluation of patient's condition.   Medications Ordered in ED Medications  acetaminophen (TYLENOL) tablet 650 mg (has no administration in time range)    Or  acetaminophen (TYLENOL) suppository 650 mg (has no administration in time range)  oxyCODONE (Oxy IR/ROXICODONE) immediate release tablet 5 mg (has no administration in time range)  ondansetron (ZOFRAN) tablet 4 mg (has no administration in time range)    Or  ondansetron (ZOFRAN) injection 4 mg (has no administration in time range)  albuterol (PROVENTIL) (2.5 MG/3ML) 0.083% nebulizer solution 2.5 mg (has no administration in time range)  0.9 %  sodium chloride infusion (Manually program via Guardrails IV Fluids) ( Intravenous New Bag/Given 04/26/21 2149)  ferrous sulfate tablet 325 mg (325 mg Oral Given 04/26/21 2204)    ED Course  I have reviewed the triage vital signs and the nursing notes.  Pertinent labs & imaging results that were available during my care of the patient were reviewed by me and considered in my medical decision making (see chart for details).    MDM Rules/Calculators/A&P                          Sean [name] is a 20 y.o. 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  According to mother and patient, he recently graduated from high school and has been doing fairly well but the last couple days of is very fatigued.  He is having some occasional exertional shortness of breath.  He denies vomiting but has some mild nausea.  Denies any dark tarry stools or any bowel changes.  Reports on Friday, he had a near syncopal episode but did not fully pass out.  He went to an urgent care where he felt better after some Zofran and p.o. challenge.  He says that today he continued to feel very fatigued and lightheaded and like he might pass out.  Family reports no history of sickle cell or other blood disorders although she does report that people of been anemic in the family.  Patient denies any trauma or abdominal pain.  On exam, lungs clear and chest nontender.  Patient is very pale and mucous membranes and and conjunctiva.  Fecal occult was collected and was negative.  Abdomen nontender.  Lungs clear.  Patient otherwise well-appearing.  Hemoglobin was found to be 4.9.  Suspect significant symptomatic anemia.  Patient will be given blood and will be admitted.  A anemia panel was ordered although with lack of GI bleeding, unclear source of his anemia.  Patient be admitted for symptomatic anemia.  He will get blood and family is on board with admission.  Patient admitted in stable condition.    Final Clinical Impression(s) / ED Diagnoses Final diagnoses:  Symptomatic anemia  Near syncope     Clinical Impression: No diagnosis found.  Disposition: Admit  This note was prepared with assistance of Conservation officer, historic buildings. Occasional wrong-word or sound-a-like substitutions may have occurred due to the inherent limitations of voice recognition software.     Presleigh Feldstein, Canary Brim, MD 04/27/21 [phone]

## 2021-04-26 NOTE — ED Triage Notes (Signed)
Pt came in with c/o weakness. Pt was seen at [facility] on [address] for syncopal episode. Pt has decreased appetite and nausea.

## 2021-04-26 NOTE — ED Provider Notes (Addendum)
Emergency Medicine Provider Triage Evaluation Note  Sean [name] , a 20 y.o. male  was evaluated in triage.  Pt complains of weakness.  He has been having episodes of vomiting the last few days.  He had 1 episode of syncope 2 days ago.  No episodes of vomiting today.  Was evaluated on 6/10 for similar complaints, states today was different as he is more weak than he has been.  He was previously taking growth hormone, but stopped that a few months ago.  Review of Systems  Positive: Weakness, nausea, vomiting, syncope  Negative: Chest pain, shortness of breath, abdominal pain  Physical Exam  BP (!) 111/57 (BP Location: Right Arm)   Pulse (!) 105   Temp 98.8 F (37.1 C) (Oral) Comment: tylenol @ 14:30  Resp 15   Ht 5\' 4"  (1.626 m)   Wt 52.2 kg   SpO2 100%   BMI 19.74 kg/m  Gen:   Awake, no distress   Resp:  Normal effort  MSK:   Moves extremities without difficulty  Other:  Tachycardic, but regular rhythm   Medical Decision Making  Medically screening exam initiated at 8:07 PM.  Appropriate orders placed.  Sean [name] was informed that the remainder of the evaluation will be completed by another provider, this initial triage assessment does not replace that evaluation, and the importance of remaining in the ED until their evaluation is complete.     [name], PA-C 04/26/21 2008    [address], PA-C 04/26/21 2010    [place], 06/26/21, MD 04/27/21 0100

## 2021-04-27 ENCOUNTER — Telehealth (INDEPENDENT_AMBULATORY_CARE_PROVIDER_SITE_OTHER): Payer: Self-pay | Admitting: Family

## 2021-04-27 ENCOUNTER — Encounter (HOSPITAL_COMMUNITY): Payer: Self-pay | Admitting: Family Medicine

## 2021-04-27 ENCOUNTER — Inpatient Hospital Stay (HOSPITAL_COMMUNITY): Payer: 59

## 2021-04-27 DIAGNOSIS — J45909 Unspecified asthma, uncomplicated: Secondary | ICD-10-CM

## 2021-04-27 DIAGNOSIS — D696 Thrombocytopenia, unspecified: Secondary | ICD-10-CM

## 2021-04-27 DIAGNOSIS — I959 Hypotension, unspecified: Secondary | ICD-10-CM | POA: Diagnosis not present

## 2021-04-27 DIAGNOSIS — E230 Hypopituitarism: Secondary | ICD-10-CM | POA: Diagnosis present

## 2021-04-27 DIAGNOSIS — E343 Short stature due to endocrine disorder: Secondary | ICD-10-CM

## 2021-04-27 DIAGNOSIS — Z79899 Other long term (current) drug therapy: Secondary | ICD-10-CM | POA: Diagnosis not present

## 2021-04-27 DIAGNOSIS — D649 Anemia, unspecified: Secondary | ICD-10-CM | POA: Diagnosis not present

## 2021-04-27 DIAGNOSIS — Z20822 Contact with and (suspected) exposure to covid-19: Secondary | ICD-10-CM | POA: Diagnosis present

## 2021-04-27 DIAGNOSIS — Q5310 Unspecified undescended testicle, unilateral: Secondary | ICD-10-CM | POA: Diagnosis not present

## 2021-04-27 DIAGNOSIS — R55 Syncope and collapse: Secondary | ICD-10-CM | POA: Diagnosis present

## 2021-04-27 DIAGNOSIS — Z68.41 Body mass index (BMI) pediatric, 85th percentile to less than 95th percentile for age: Secondary | ICD-10-CM | POA: Diagnosis not present

## 2021-04-27 DIAGNOSIS — D509 Iron deficiency anemia, unspecified: Secondary | ICD-10-CM | POA: Diagnosis present

## 2021-04-27 DIAGNOSIS — E441 Mild protein-calorie malnutrition: Secondary | ICD-10-CM | POA: Diagnosis present

## 2021-04-27 DIAGNOSIS — R011 Cardiac murmur, unspecified: Secondary | ICD-10-CM | POA: Diagnosis present

## 2021-04-27 LAB — CBC
HCT: 22.5 % — ABNORMAL LOW (ref 39.0–52.0)
HCT: 21.3 % — ABNORMAL LOW (ref 39.0–52.0)
Hemoglobin: 7.6 g/dL — ABNORMAL LOW (ref 13.0–17.0)
Hemoglobin: 7.2 g/dL — ABNORMAL LOW (ref 13.0–17.0)
MCH: 27.2 pg (ref 26.0–34.0)
MCH: 27.5 pg (ref 26.0–34.0)
MCHC: 33.8 g/dL (ref 30.0–36.0)
MCHC: 33.8 g/dL (ref 30.0–36.0)
MCV: 80.6 fL (ref 80.0–100.0)
MCV: 81.3 fL (ref 80.0–100.0)
Platelets: 109 10*3/uL — ABNORMAL LOW (ref 150–400)
Platelets: 109 10*3/uL — ABNORMAL LOW (ref 150–400)
RBC: 2.79 MIL/uL — ABNORMAL LOW (ref 4.22–5.81)
RBC: 2.62 MIL/uL — ABNORMAL LOW (ref 4.22–5.81)
RDW: 16.7 % — ABNORMAL HIGH (ref 11.5–15.5)
RDW: 16.5 % — ABNORMAL HIGH (ref 11.5–15.5)
WBC: 8.5 10*3/uL (ref 4.0–10.5)
WBC: 8.3 10*3/uL (ref 4.0–10.5)
nRBC: 0.4 % — ABNORMAL HIGH (ref 0.0–0.2)
nRBC: 0.2 % (ref 0.0–0.2)

## 2021-04-27 LAB — COMPREHENSIVE METABOLIC PANEL
ALT: 12 U/L (ref 0–44)
AST: 15 U/L (ref 15–41)
Albumin: 3.1 g/dL — ABNORMAL LOW (ref 3.5–5.0)
Alkaline Phosphatase: 46 U/L (ref 38–126)
Anion gap: 7 (ref 5–15)
BUN: 19 mg/dL (ref 6–20)
CO2: 23 mmol/L (ref 22–32)
Calcium: 8.4 mg/dL — ABNORMAL LOW (ref 8.9–10.3)
Chloride: 104 mmol/L (ref 98–111)
Creatinine, Ser: 0.72 mg/dL (ref 0.61–1.24)
GFR, Estimated: >60 mL/min (ref >60)
Glucose, Bld: 88 mg/dL (ref 70–99)
Potassium: 3.6 mmol/L (ref 3.5–5.1)
Sodium: 134 mmol/L — ABNORMAL LOW (ref 135–145)
Total Bilirubin: 0.4 mg/dL (ref 0.3–1.2)
Total Protein: 5 g/dL — ABNORMAL LOW (ref 6.5–8.1)

## 2021-04-27 LAB — TSH: TSH: 1.42 u[IU]/mL (ref 0.350–4.500)

## 2021-04-27 LAB — ABO/RH: ABO/RH(D): O POS

## 2021-04-27 LAB — SAVE SMEAR(SSMR), FOR PROVIDER SLIDE REVIEW

## 2021-04-27 LAB — MAGNESIUM: Magnesium: 1.7 mg/dL (ref 1.7–2.4)

## 2021-04-27 LAB — PROTIME-INR
INR: 1.1 (ref 0.8–1.2)
Prothrombin Time: 14.2 seconds (ref 11.4–15.2)

## 2021-04-27 LAB — HIV ANTIBODY (ROUTINE TESTING W REFLEX): HIV Screen 4th Generation wRfx: NONREACTIVE

## 2021-04-27 MED ORDER — SODIUM CHLORIDE 0.9 % IV SOLN
510.0000 mg | Freq: Once | INTRAVENOUS | Status: AC
  Administered 2021-04-27: 510 mg via INTRAVENOUS
  Filled 2021-04-27: qty 510

## 2021-04-27 MED ORDER — IOHEXOL 9 MG/ML PO SOLN
500.0000 mL | ORAL | Status: AC
  Administered 2021-04-27 (×2): 500 mL via ORAL

## 2021-04-27 MED ORDER — LIP MEDEX EX OINT
TOPICAL_OINTMENT | CUTANEOUS | Status: AC
  Filled 2021-04-27: qty 7

## 2021-04-27 NOTE — ED Notes (Signed)
Pt ambulatory to restroom w/out assistance.  

## 2021-04-27 NOTE — Telephone Encounter (Signed)
Returned call to mom to follow up, per mom he was critical on admission.  The hospital doctor told mom he would be reaching out to [facility] to see how he has previously been regarding his growth hormone therapy etc.  She wanted to let us know that the hospital would be reaching out.   Routed message to the oncall provider and sent him a secure chat with this information.

## 2021-04-27 NOTE — Consult Note (Addendum)
[facility]  Telephone:([phone] Fax:(336) [phone]    [place]  Referring MD:  Dr. Bonnielee Haff  Reason for Referral: anemia   HPI: Sean [name] is a 20 year old male with a past medical history significant for asthma, constitutional growth delay, growth hormone deficiency, and short stature for age.  He presented to the emergency room for near syncope.  History obtained from the patient's mother.  3 days prior to admission, the patient started to complain of weakness and feeling lightheaded.  His mother thought that he looked pale.  He had a near syncopal episode recently where he hit his head on the wall but did not lose consciousness.  He went to urgent care and was noted to be hypotensive and was given IV fluids and Zofran.  His blood pressure normalized with these interventions.  Over the weekend, he developed a decrease in appetite and decrease in activity level.  On the day of admission, the patient had persistent weakness and fatigue and family thought that he felt warm so he was given Tylenol.  He rested and when he was woken up, reported that he was too dizzy to get up and therefore was brought to the emergency department.  Initial labs showed a WBC of 9.9, hemoglobin 4.9, MCV 76.7, platelets 157,000, glucose 134, BUN 26, calcium 8.4, total protein 5.5, albumin 3.2.  Immature reticulocyte fraction was elevated at 44.1%.  Stool for occult blood negative.  Vitamin B12 level was normal at 367, folate 8.4, iron 82, TIBC 251, and percent saturation 33%.  Ferritin was low at 16%.  The patient has received 2 units PRBC so far this admission and has also been given a dose of Feraheme 510 mg IV x1 dose.  Hemoglobin this morning has improved to 7.6, platelets have drifted down to 109,000.  I do not have a CBC prior to this admission available to me in epic or Care Everywhere.  The patient was seen in his hospital room today.  His mother, father, and twin  brother were at the bedside.  He reports that he feels much better today.  The patient tells me that he was having headaches and dizziness/lightheadedness prior to admission.  These symptoms have resolved.  The patient denies recent fevers, chills, night sweats, chest pain, shortness of breath, cough, abdominal pain, nausea, vomiting, bleeding.  The patient was previously receiving growth hormone but this was discontinued on 04/03/2021 as his bone age x-ray showed fusion of the bone on the hand x-ray.  It was felt that height growth was likely completed.  Patient is single.  He recently graduated high school.  He is planning to go to [employer] in the fall with subsequent transfer to a 4-year college to become a Civil engineer, contracting.  He lives with his parents and siblings.  Has history of alcohol and tobacco use.  Mother reported having anemia during pregnancy which has resolved. Denies other family history of anemia. Hematology was asked to see the patient to make recommendations regarding his anemia.   Past Medical History:  Diagnosis Date   Asthma    prn inhaler/neb.   Constitutional growth delay    Eczema    Heart murmur    functional, per cardiologist 11/12/2015   Inguinal hernia 11/2015   Seasonal allergies    Short stature for age    Twin birth   :     Past Surgical History:  Procedure Laterality Date   INGUINAL HERNIA REPAIR Right 11/27/2015  Procedure: HERNIA REPAIR INGUINAL PEDIATRIC;  Surgeon: Gerald Stabs, MD;  Location: [facility];  Service: Pediatrics;  Laterality: Right;   ORCHIOPEXY     age 18  :   CURRENT MEDS: Current Facility-Administered Medications  Medication Dose Route Frequency Provider Last Rate Last Admin   acetaminophen (TYLENOL) tablet 650 mg  650 mg Oral Q6H PRN Zierle-Ghosh, Asia B, DO       Or   acetaminophen (TYLENOL) suppository 650 mg  650 mg Rectal Q6H PRN Zierle-Ghosh, Asia B, DO       albuterol (PROVENTIL) (2.5 MG/3ML) 0.083% nebulizer  solution 2.5 mg  2.5 mg Nebulization TID PRN Zierle-Ghosh, Asia B, DO       ondansetron (ZOFRAN) tablet 4 mg  4 mg Oral Q6H PRN Zierle-Ghosh, Asia B, DO       Or   ondansetron (ZOFRAN) injection 4 mg  4 mg Intravenous Q6H PRN Zierle-Ghosh, Asia B, DO       oxyCODONE (Oxy IR/ROXICODONE) immediate release tablet 5 mg  5 mg Oral Q4H PRN Zierle-Ghosh, Asia B, DO          No Known Allergies:   Family History  Problem Relation Age of Onset   Delayed puberty Father    Thyroid disease Neg Hx   :   Social History   Socioeconomic History   Marital status: Single    Spouse name: Not on file   Number of children: Not on file   Years of education: Not on file   Highest education level: Not on file  Occupational History   Not on file  Tobacco Use   Smoking status: Never   Smokeless tobacco: Never  Vaping Use   Vaping Use: Never used  Substance and Sexual Activity   Alcohol use: No   Drug use: No   Sexual activity: Not on file  Other Topics Concern   Not on file  Social History Narrative   11 th grade at [employer], Lives with parents twin brother and sister   Social Determinants of Health   Financial Resource Strain: Not on file  Food Insecurity: Not on file  Transportation Needs: Not on file  Physical Activity: Not on file  Stress: Not on file  Social Connections: Not on file  Intimate Partner Violence: Not on file  :  REVIEW OF SYSTEMS:  A comprehensive 14 point review of systems was negative except as noted in the HPI.    Exam: Patient Vitals for the past 24 hrs:  BP Temp Temp src Pulse Resp SpO2 Height Weight  04/27/21 0913 111/66 -- -- 86 20 100 % -- --  04/27/21 0857 115/70 -- -- -- 20 100 % -- --  04/27/21 0850 121/66 98.5 F (36.9 C) Oral 86 20 100 % -- --  04/27/21 0735 -- 98.2 F (36.8 C) Oral -- -- -- -- --  04/27/21 0715 108/60 -- -- 77 18 100 % -- --  04/27/21 0630 112/60 -- -- 84 (!) 21 100 % -- --  04/27/21 0605 (!) 110/52 98.2 F (36.8 C)  Oral 84 19 100 % -- --  04/27/21 0545 (!) 110/52 -- -- 84 19 100 % -- --  04/27/21 0542 -- 98.2 F (36.8 C) Oral -- -- -- -- --  04/27/21 0530 114/62 -- -- 94 17 100 % -- --  04/27/21 0310 (!) 111/55 98.6 F (37 C) Oral 84 18 -- -- --  04/27/21 0300 107/62 -- -- 80 18 100 % -- --  04/27/21 0255 111/60 -- Oral 80 16 -- -- --  04/27/21 0239 -- 98.4 F (36.9 C) Oral -- -- -- -- --  04/27/21 0222 (!) 139/109 -- -- 98 18 100 % -- --  04/27/21 0100 (!) 118/55 -- -- 89 20 100 % -- --  04/27/21 0030 (!) 118/54 -- -- 92 14 100 % -- --  04/26/21 2340 (!) 121/56 99 F (37.2 C) -- 91 14 -- -- --  04/26/21 2330 (!) 117/56 -- -- 89 19 100 % -- --  04/26/21 2325 (!) 117/57 99.4 F (37.4 C) Oral 92 16 -- -- --  04/26/21 2100 (!) 107/53 -- -- 90 (!) 21 100 % -- --  04/26/21 1957 (!) 111/57 98.8 F (37.1 C) Oral (!) 105 15 100 % '5\' 4"'  (1.626 m) 52.2 kg    General:  well-nourished in no acute distress.   Eyes:  no scleral icterus.   ENT:  There were no oropharyngeal lesions.   Lymphatics:  Negative cervical, supraclavicular,axillary, inguinal adenopathy.   Respiratory: lungs were clear bilaterally without wheezing or crackles.   Cardiovascular:  Regular rate and rhythm, S1/S2, without murmur, rub or gallop.  There was no pedal edema.   GI:  abdomen was soft, flat, nontender, nondistended, without organomegaly.   Musculoskeletal: Strength symmetrical in the upper and lower extremities. Skin exam was without ecchymosis, petechiae.   Neuro exam was nonfocal.  Patient was alert and oriented.  Attention was good.   Language was appropriate.  Mood was normal without depression.  Speech was not pressured.  Thought content was not tangential.    LABS:  Lab Results  Component Value Date   WBC 8.5 04/27/2021   HGB 7.6 (L) 04/27/2021   HCT 22.5 (L) 04/27/2021   PLT 109 (L) 04/27/2021   GLUCOSE 88 04/27/2021   ALT 12 04/27/2021   AST 15 04/27/2021   NA 134 (L) 04/27/2021   K 3.6 04/27/2021   CL 104  04/27/2021   CREATININE 0.72 04/27/2021   BUN 19 04/27/2021   CO2 23 04/27/2021   INR 1.1 04/27/2021    No results found.   ASSESSMENT AND PLAN:  1.  Microcytic anemia 2.  Thrombocytopenia 3.  Near syncope secondary to #1, resolved 4.  History of hypopituitarism/growth hormone deficiency  -The patient has significant microcytic anemia on admission.  No prior CBC to compare to.  He was found to have mild iron deficiency anemia.  He is status post 2 units PRBCs and Feraheme 510 mg IV x1 dose with improvement of his symptoms. -Platelets completely normal on admission but have dropped down slightly this morning.  He is not bleeding. -Will obtain additional work-up including review peripheral blood smear, LDH, hemoglobin electrophoresis, beta hCG, and AFP. -The patient was on porthole but recently stopped on 5/20 due to fusion of bones.  Unclear discontinuation of growth hormone may be playing into his anemia.  Thank you for this referral.  Mikey Bussing, DNP, AGPCNP-BC, AOCNP   ADDENDUM: I saw and examined Sean [name].  His family was in the room. I agree with the above note by Erasmo Downer. I looked at  his blood smear.  I did not see anything on the blood smear that looked suspicious.  I know that he does have some underlying growth issues.  I know that he has been on growth hormone.  I worry about the possibility of red cell aplasia.  I know that he had a CT scan done of the body.  This foot did not show much of anything.  There was a residual thymus that was noted but was felt to be normal for age.  I cannot help but noted that he had an undescended testicle.  I did not see anything on the CT scan that look like malignancy with respect to testicular cancer.  However, it would not hurt to get some markers.  There is no history of sickle cell disease.  Given that his family is from [place], he would not be a bad idea to check a hemoglobin electrophoresis.  Ultimately, he is going to need a  bone marrow biopsy.  I do worry about the possibility of red cell aplasia.  I am sending off an erythropoietin level on him which oftentimes is quite high with red cell aplasia.  We will have to check his reticulocyte count.  It has been quite low.  He is not bleeding.  Again, I think that the bone marrow biopsy will give [place] the answer.  I spoke to his family.  They are all very nice.  It is hard to say what to do right now unless we know exactly what the etiology is of this anemia.  I noted that he had a little bit of iron deficiency.  I would have to think he would have to be profoundly iron deficient to have a hemoglobin as low as it was when he came in.  Of note, he has not had any illness recently.  There has been no kind of viral syndrome issues.  I suppose we will check him for parvovirus.  This is very interesting.  I would like to think that we will find the etiology.  I am not sure what his underlying metabolic disorder is causing the growth issues and hormone deficiencies.  It is certainly possible that this is all related.  I feel very confident that he will get outstanding care by all the staff up on 6 E.  Lattie Haw, MD  Hebrews 12:14

## 2021-04-27 NOTE — Telephone Encounter (Signed)
Who's calling (name and relationship to patient) : Olu Spagnoli  Best contact number: [phone]  Provider they see: Gretchen Short  Reason for call: Mother called informing us that patient has admitted to hospital due to low levels of hemoglobin. Hospital will need lab work mom states they are in [place]  Call ID:      PRESCRIPTION REFILL ONLY  Name of prescription:  Pharmacy:

## 2021-04-27 NOTE — Progress Notes (Signed)
[facility] PROGRESS NOTE   Sean [name] BBC:[id number] DOB: 2001/07/15 DOA: 04/26/2021  PCP: Clovis Riley, L.August Saucer, MD  Brief History/Interval Summary: 20 y.o. male, with history of asthma, constitutional growth delay, growth hormone deficiency, short stature for age, presented to the ED for chief complaint of near syncope.  Patient apparently has been experiencing generalized weakness and feeling lightheaded for a few days.  He started working at [employer], a [employer], recently.  Apparently they went to urgent care center after he had a near syncopal episode and he was given IV fluids.  She found him paler than usual.  On the day of presentation he was weak and fatigued and once again had a near syncopal episode.  Did not really pass out completely.  Has not noticed any kind of bleeding from any source site recently.  Denies any black stools.  Denies any fever or chills recently.  No acute illness recently.  No previous history of anemia.  No history of sickle cell disease or trait in the family.  Noted to have a hemoglobin of 4.9.  Blood transfusion was initiated and he was hospitalized.  Consultants: We will consult hematology  Procedures: Blood transfusion  Antibiotics: Anti-infectives (From admission, onward)    None       Subjective/Interval History: Patient continues to feel fatigued but slightly better compared to yesterday.  Denies any shortness of breath chest pain nausea or vomiting.  His mother is at the bedside.   Assessment/Plan:  Severe symptomatic microcytic anemia with near syncope/iron deficiency Presented with a hemoglobin of 4.9 which was likely reason for his fatigue for the last several days.  Unfortunately we do not have any old CBCs in our system or even through care everywhere.  I called his primary care physician's office who have records from his pediatrician's office and they also do not have any CBCs in their system. Looks like an  anemia panel was sent off yesterday evening which showed ferritin of 16, folate 8.4, B12 367.  Patient's MCV was 76.7.  RDW was normal.  Absolute reticulocyte count was within the normal range.  Fecal occult blood testing was negative.  HIV is negative.  TSH is normal at 1.42. Will discuss with hematology as this patient will benefit from hematology evaluation.  Will do hemoglobin electrophoresis. Recheck hemoglobin later today and tomorrow. Posttransfusion hemoglobin is 7.6.  We will start mobilizing. Give him a dose of Feraheme for low ferritin  Thrombocytopenia Yesterday's platelet counts were normal.  This morning after transfusion is 109,000 which is slightly on the lower side.  We will recheck tomorrow.  History of hypopituitarism/growth hormone deficiency He is followed by pediatric endocrinology.  Last seen by them in May.  His growth hormone injections were discontinued at that time since his growth had ceased which was consistent with fusion of bones on x-ray of hand.  Plan was to repeat IGF 1 and BP 3 in 3 months.    DVT Prophylaxis: SCDs Code Status: Full code Family Communication: Discussed with the patient and his mother Disposition Plan: Hopefully return home when improved  Status is: Observation  The patient will require care spanning > 2 midnights and should be moved to inpatient because: Ongoing diagnostic testing needed not appropriate for outpatient work up and Inpatient level of care appropriate due to severity of illness  Dispo: The patient is from: Home              Anticipated d/c is to: Home  Patient currently is not medically stable to d/c.   Difficult to place patient No      Medications: Scheduled: Continuous: EHU:[id number] **OR** acetaminophen, albuterol, ondansetron **OR** ondansetron (ZOFRAN) IV, oxyCODONE   Objective:  Vital Signs  Vitals:   04/27/21 0735 04/27/21 0850 04/27/21 0857 04/27/21 0913  BP:  121/66 115/70 111/66   Pulse:  86  86  Resp:  20 20 20   Temp: 98.2 F (36.8 C) 98.5 F (36.9 C)    TempSrc: Oral Oral    SpO2:  100% 100% 100%  Weight:      Height:        Intake/Output Summary (Last 24 hours) at 04/27/2021 0923 Last data filed at 04/27/2021 0310 Gross per 24 hour  Intake 630 ml  Output --  Net 630 ml   Filed Weights   04/26/21 1957  Weight: 52.2 kg    General appearance: Awake alert.  In no distress Resp: Clear to auscultation bilaterally.  Normal effort Cardio: S1-S2 is normal regular.  No S3-S4.  No rubs murmurs or bruit GI: Abdomen is soft.  Nontender nondistended.  Bowel sounds are present normal.  No masses organomegaly Extremities: No edema.  Full range of motion of lower extremities. Neurologic:  No focal neurological deficits.    Lab Results:  Data Reviewed: I have personally reviewed following labs and imaging studies  CBC: Recent Labs  Lab 04/26/21 2005 04/27/21 0740  WBC 9.9 8.5  NEUTROABS 6.2  --   HGB 4.9* 7.6*  HCT 15.8* 22.5*  MCV 76.7* 80.6  PLT 157 109*    Basic Metabolic Panel: Recent Labs  Lab 04/26/21 2005 04/27/21 0740  NA 136 134*  K 4.1 3.6  CL 106 104  CO2 26 23  GLUCOSE 134* 88  BUN 26* 19  CREATININE 0.75 0.72  CALCIUM 8.4* 8.4*  MG  --  1.7    GFR: Estimated Creatinine Clearance: 109.7 mL/min (by C-G formula based on SCr of 0.72 mg/dL).  Liver Function Tests: Recent Labs  Lab 04/26/21 2005 04/27/21 0740  AST 18 15  ALT 12 12  ALKPHOS 56 46  BILITOT 0.2* 0.4  PROT 5.5* 5.0*  ALBUMIN 3.2* 3.1*    Recent Labs  Lab 04/26/21 2148  LIPASE 29     Coagulation Profile: Recent Labs  Lab 04/26/21 2148 04/27/21 0740  INR 1.2 1.1     Thyroid Function Tests: Recent Labs    04/27/21 0740  TSH 1.420    Anemia Panel: Recent Labs    04/26/21 2120 04/26/21 2148  VITAMINB12  --  367  FOLATE  --  8.4  FERRITIN  --  16*  TIBC  --  251  IRON  --  82  RETICCTPCT 4.0*  --     Recent Results (from the past  240 hour(s))  Resp Panel by RT-PCR (Flu A&B, Covid) Nasopharyngeal Swab     Status: None   Collection Time: 04/26/21 10:01 PM   Specimen: Nasopharyngeal Swab; Nasopharyngeal(NP) swabs in vial transport medium  Result Value Ref Range Status   SARS Coronavirus 2 by RT PCR NEGATIVE NEGATIVE Final    Comment: (NOTE) SARS-CoV-2 target nucleic acids are NOT DETECTED.  The SARS-CoV-2 RNA is generally detectable in upper respiratory specimens during the acute phase of infection. The lowest concentration of SARS-CoV-2 viral copies this assay can detect is 138 copies/mL. A negative result does not preclude SARS-Cov-2 infection and should not be used as the sole basis for treatment or other patient  management decisions. A negative result may occur with  improper specimen collection/handling, submission of specimen other than nasopharyngeal swab, presence of viral mutation(s) within the areas targeted by this assay, and inadequate number of viral copies(<138 copies/mL). A negative result must be combined with clinical observations, patient history, and epidemiological information. The expected result is Negative.  Fact Sheet for Patients:  BloggerCourse.com  Fact Sheet for Healthcare Providers:  SeriousBroker.it  This test is no t yet approved or cleared by the [place] FDA and  has been authorized for detection and/or diagnosis of SARS-CoV-2 by FDA under an Emergency Use Authorization (EUA). This EUA will remain  in effect (meaning this test can be used) for the duration of the COVID-19 declaration under Section 564(b)(1) of the Act, 21 U.S.C.section 360bbb-3(b)(1), unless the authorization is terminated  or revoked sooner.       Influenza A by PCR NEGATIVE NEGATIVE Final   Influenza B by PCR NEGATIVE NEGATIVE Final    Comment: (NOTE) The Xpert Xpress SARS-CoV-2/FLU/RSV plus assay is intended as an aid in the diagnosis of influenza  from Nasopharyngeal swab specimens and should not be used as a sole basis for treatment. Nasal washings and aspirates are unacceptable for Xpert Xpress SARS-CoV-2/FLU/RSV testing.  Fact Sheet for Patients: BloggerCourse.com  Fact Sheet for Healthcare Providers: SeriousBroker.it  This test is not yet approved or cleared by the [place] FDA and has been authorized for detection and/or diagnosis of SARS-CoV-2 by FDA under an Emergency Use Authorization (EUA). This EUA will remain in effect (meaning this test can be used) for the duration of the COVID-19 declaration under Section 564(b)(1) of the Act, 21 U.S.C. section 360bbb-3(b)(1), unless the authorization is terminated or revoked.  Performed at [facility], [address] [place], [place] [address]       Radiology Studies: No results found.     LOS: 0 days   Armstead Heiland [employer] on [email or web address]  04/27/2021, 9:23 AM

## 2021-04-28 ENCOUNTER — Encounter (HOSPITAL_COMMUNITY): Payer: Self-pay | Admitting: Internal Medicine

## 2021-04-28 ENCOUNTER — Inpatient Hospital Stay (HOSPITAL_COMMUNITY): Payer: 59

## 2021-04-28 LAB — CBC
HCT: 20.1 % — ABNORMAL LOW (ref 39.0–52.0)
Hemoglobin: 6.9 g/dL — CL (ref 13.0–17.0)
MCH: 28 pg (ref 26.0–34.0)
MCHC: 34.3 g/dL (ref 30.0–36.0)
MCV: 81.7 fL (ref 80.0–100.0)
Platelets: 115 10*3/uL — ABNORMAL LOW (ref 150–400)
RBC: 2.46 MIL/uL — ABNORMAL LOW (ref 4.22–5.81)
RDW: 16.6 % — ABNORMAL HIGH (ref 11.5–15.5)
WBC: 6.5 10*3/uL (ref 4.0–10.5)
nRBC: 0.5 % — ABNORMAL HIGH (ref 0.0–0.2)

## 2021-04-28 LAB — BASIC METABOLIC PANEL
Anion gap: 6 (ref 5–15)
BUN: 15 mg/dL (ref 6–20)
CO2: 25 mmol/L (ref 22–32)
Calcium: 8.9 mg/dL (ref 8.9–10.3)
Chloride: 106 mmol/L (ref 98–111)
Creatinine, Ser: 0.79 mg/dL (ref 0.61–1.24)
GFR, Estimated: >60 mL/min (ref >60)
Glucose, Bld: 89 mg/dL (ref 70–99)
Potassium: 3.8 mmol/L (ref 3.5–5.1)
Sodium: 137 mmol/L (ref 135–145)

## 2021-04-28 LAB — RETICULOCYTES
Immature Retic Fract: 51.9 % — ABNORMAL HIGH (ref 2.3–15.9)
RBC.: 2.47 MIL/uL — ABNORMAL LOW (ref 4.22–5.81)
Retic Count, Absolute: 119.5 10*3/uL (ref 19.0–186.0)
Retic Ct Pct: 4.8 % — ABNORMAL HIGH (ref 0.4–3.1)

## 2021-04-28 LAB — BETA HCG QUANT (REF LAB): hCG Quant: <1 m[IU]/mL (ref 0–3)

## 2021-04-28 LAB — AFP TUMOR MARKER: AFP, Serum, Tumor Marker: 1.4 ng/mL (ref 0.0–5.7)

## 2021-04-28 LAB — PREPARE RBC (CROSSMATCH)

## 2021-04-28 LAB — LACTATE DEHYDROGENASE: LDH: 77 U/L — ABNORMAL LOW (ref 98–192)

## 2021-04-28 MED ORDER — FLUMAZENIL 0.5 MG/5ML IV SOLN
INTRAVENOUS | Status: AC
  Filled 2021-04-28: qty 5

## 2021-04-28 MED ORDER — DIPHENHYDRAMINE HCL 50 MG/ML IJ SOLN
INTRAMUSCULAR | Status: AC
  Filled 2021-04-28: qty 1

## 2021-04-28 MED ORDER — MIDAZOLAM HCL 2 MG/2ML IJ SOLN
INTRAMUSCULAR | Status: AC
  Filled 2021-04-28: qty 4

## 2021-04-28 MED ORDER — FENTANYL CITRATE (PF) 100 MCG/2ML IJ SOLN
INTRAMUSCULAR | Status: AC | PRN
  Administered 2021-04-28 (×2): 50 ug via INTRAVENOUS

## 2021-04-28 MED ORDER — FENTANYL CITRATE (PF) 100 MCG/2ML IJ SOLN
INTRAMUSCULAR | Status: AC
  Filled 2021-04-28: qty 2

## 2021-04-28 MED ORDER — MIDAZOLAM HCL 2 MG/2ML IJ SOLN
INTRAMUSCULAR | Status: AC | PRN
  Administered 2021-04-28 (×2): 1 mg via INTRAVENOUS

## 2021-04-28 MED ORDER — LIDOCAINE HCL 1 % IJ SOLN
INTRAMUSCULAR | Status: AC | PRN
  Administered 2021-04-28: 10 mL via INTRADERMAL

## 2021-04-28 MED ORDER — SODIUM CHLORIDE 0.9% IV SOLUTION: Freq: Once | INTRAVENOUS | Status: DC

## 2021-04-28 MED ORDER — NALOXONE HCL 0.4 MG/ML IJ SOLN
INTRAMUSCULAR | Status: AC
  Filled 2021-04-28: qty 1

## 2021-04-28 NOTE — Progress Notes (Signed)
The hemoglobin is slowly dropping again.  There is no obvious bleeding.  The LDH is only 77.  His white cell count is okay at 6.5.  Platelet count is little bit low at 115,000.  The MCV is 82.  His corrected reticulocyte count is not all that bad.  I still think that the bone marrow biopsy is can be critical.  I still have to worry about red cell aplasia even though I would expect that the reticulocyte count would be lower.  We are are awaiting the hemoglobin electrophoresis also.  We are awaiting the erythropoietin level.  His BUN and creatinine are okay.  We will just have to await the bone marrow test.  I still have to believe that this anemia is somehow related to this developmental issue that he has.  I do appreciate the great care that he is getting from all staff up on 6 E.  Lattie Haw, MD  Hebrews 12:12

## 2021-04-28 NOTE — H&P (Signed)
Chief Complaint: Patient was seen in consultation today for image guided bone marrow biopsy and aspiration Chief Complaint  Patient presents with   Weakness   at the request of Dr. Marin Olp  Referring Physician(s): Dr. Marin Olp  Supervising Physician: Jacqulynn Cadet  Patient Status: [facility] - In-pt  History of Present Illness: Sean [name] is a 20 y.o. male with PMH of asthma, constitutional growth delay, growth hormone deficiency who presented to ED with chief complaint of near syncope, found to have severe anemia with hemoglobin of 4.9.  Received blood transfusion and and patient was admitted for further evaluation and management of anemia.  Patient has no history of anemia and no history of sickle cell disease or trait in the family. Patient denied any signs and symptoms of GI bleed and Hemoccult was negative.  Repeat CBC showed dropping in hemoglobin from 7.6 posttransfusion to 7.2 yesterday and 6.9 this morning without known source of acute bleeding.  Hematology and oncology was consulted upon admission, who recommended a bone marrow biopsy and aspiration for further evaluation and management of anemia.  After thorough discussion and shared decision making, patient decided to proceed with the bone marrow biopsy.  IR was requested for image guided bone marrow biopsy and aspiration.  Patient laying in bed, not in acute distress.  Denise headache, fever, chills, shortness of breath, cough, chest pain, abdominal pain, nausea ,vomiting, and bleeding.   Past Medical History:  Diagnosis Date   Asthma    prn inhaler/neb.   Constitutional growth delay    Eczema    Heart murmur    functional, per cardiologist 11/12/2015   Inguinal hernia 11/2015   Seasonal allergies    Short stature for age    Twin birth     Past Surgical History:  Procedure Laterality Date   INGUINAL HERNIA REPAIR Right 11/27/2015   Procedure: HERNIA REPAIR INGUINAL PEDIATRIC;  Surgeon: Gerald Stabs,  MD;  Location: [facility];  Service: Pediatrics;  Laterality: Right;   ORCHIOPEXY     age 26    Allergies: Patient has no known allergies.  Medications: Prior to Admission medications   Medication Sig Start Date End Date Taking? Authorizing Provider  acetaminophen (TYLENOL) 500 MG tablet Take 500 mg by mouth every 6 (six) hours as needed.   Yes [provider]  albuterol (ACCUNEB) 0.63 MG/3ML nebulizer solution Take 1 ampule by nebulization as needed. Reported on 03/31/2016   Yes [provider]  albuterol (PROVENTIL HFA;VENTOLIN HFA) 108 (90 Base) MCG/ACT inhaler Inhale into the lungs every 6 (six) hours as needed for wheezing or shortness of breath. Reported on 03/31/2016   Yes [provider]  ferrous sulfate 325 (65 FE) MG tablet Take 325 mg by mouth once.   Yes [provider]  ondansetron (ZOFRAN ODT) 4 MG disintegrating tablet Take 1 tablet (4 mg total) by mouth every 8 (eight) hours as needed for nausea or vomiting. 04/24/21  Yes Covington, Sarah M, PA-C  cyproheptadine (PERIACTIN) 4 MG tablet Take 1 tablet (4 mg total) by mouth at bedtime. Patient not taking: No sig reported 08/14/20   Hermenia Bers, NP  NUTROPIN AQ NUSPIN 10 10 MG/2ML SOPN INJECT 2MG SUBCUTANEOUSLY  DAILY (DISCARD 28 DAYS  AFTER FIRST USE) Patient not taking: Reported on 04/26/2021 11/24/20   Hermenia Bers, NP  Somatropin (NUTROPIN AQ NUSPIN 10) 10 MG/2ML SOLN INJECT 1.7MG SUBCUTANEOUSLY 6 DAYS PER WEEK (DISCARD 28 DAYS AFTER FIRST USE) Patient not taking: No sig reported 07/30/19  Hermenia Bers, NP     Family History  Problem Relation Age of Onset   Delayed puberty Father    Thyroid disease Neg Hx     Social History   Socioeconomic History   Marital status: Single    Spouse name: Not on file   Number of children: Not on file   Years of education: Not on file   Highest education level: Not on file  Occupational History   Not on file  Tobacco Use    Smoking status: Never   Smokeless tobacco: Never  Vaping Use   Vaping Use: Never used  Substance and Sexual Activity   Alcohol use: No   Drug use: No   Sexual activity: Not on file  Other Topics Concern   Not on file  Social History Narrative   11 th grade at [employer], Lives with parents twin brother and sister   Social Determinants of Health   Financial Resource Strain: Not on file  Food Insecurity: Not on file  Transportation Needs: Not on file  Physical Activity: Not on file  Stress: Not on file  Social Connections: Not on file     Review of Systems: A 12 point ROS discussed and pertinent positives are indicated in the HPI above.  All other systems are negative.   Vital Signs: BP 102/60 (BP Location: Left Arm)   Pulse 71   Temp 98.7 F (37.1 C) (Oral)   Resp 16   Ht '5\' 4"'  (1.626 m)   Wt 115 lb (52.2 kg)   SpO2 100%   BMI 19.74 kg/m   Physical Exam  Vitals and nursing note reviewed.  Constitutional:      General: He is not in acute distress.    Appearance: Normal appearance.  HENT:     Head: Normocephalic and atraumatic.     Mouth/Throat:     Mouth: Mucous membranes are moist.     Pharynx: Oropharynx is clear.  Cardiovascular:     Rate and Rhythm: Normal rate and regular rhythm.     Pulses: Normal pulses.     Heart sounds: Normal heart sounds.  Pulmonary:     Effort: Pulmonary effort is normal.     Breath sounds: Normal breath sounds. No wheezing, rhonchi or rales.  Abdominal:     General: Bowel sounds are normal. There is no distension.     Palpations: Abdomen is soft.  Skin:    General: Skin is warm and dry.  Neurological:     Mental Status: He is alert and oriented to person, place, and time.  Psychiatric:        Mood and Affect: Mood normal.        Behavior: Behavior normal.    MD Evaluation Airway: WNL Heart: WNL Abdomen: WNL Chest/ Lungs: WNL ASA  Classification: 2 Mallampati/Airway Score: One  Imaging: CT CHEST ABDOMEN  PELVIS WO CONTRAST  Result Date: 04/27/2021 CLINICAL DATA:  Recurrent near syncopal episodes with anemia of unknown etiology. History of asthma and constitutional growth 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 EXAM: CT CHEST, ABDOMEN AND PELVIS WITHOUT CONTRAST TECHNIQUE: Multidetector CT imaging of the chest, abdomen and pelvis was performed following the standard protocol without IV contrast. COMPARISON:  Scrotal ultrasound 10/07/2015 FINDINGS: CT CHEST FINDINGS Cardiovascular: No significant vascular findings on noncontrast imaging. Decreased density of the blood pool consistent with anemia. The heart size is normal. There is no pericardial effusion. Mediastinum/Nodes: There are no enlarged mediastinal, hilar or axillary lymph nodes.  Hilar assessment is limited by the lack of intravenous contrast. Residual thymic tissue in the anterior mediastinum, typical for age. The thyroid gland, trachea and esophagus demonstrate no significant findings. Lungs/Pleura: There is no pleural effusion. 4 mm ground-glass nodule in the left lower lobe on image 106/6, likely a lymph node or focus of inflammation. Per consensus guidelines, this requires no specific follow-up. The lungs are otherwise clear. Musculoskeletal/Chest wall: No chest wall mass or suspicious osseous findings. CT ABDOMEN AND PELVIS FINDINGS Hepatobiliary: The liver appears unremarkable as imaged in the noncontrast state. No evidence of gallstones, gallbladder wall thickening or biliary dilatation. Pancreas: Unremarkable. No pancreatic ductal dilatation or surrounding inflammatory changes. Spleen: Normal in size without focal abnormality. Adrenals/Urinary Tract: Both adrenal glands appear normal. Both kidneys appear unremarkable on noncontrast imaging. No evidence of urinary tract calculus or hydronephrosis. The bladder appears normal. Stomach/Bowel: Enteric contrast was administered and has passed into the mid to distal small bowel.  The stomach appears unremarkable for its degree of distension. No evidence of bowel wall thickening, distention or surrounding inflammatory change. The appendix appears normal. Moderate stool throughout the colon. Vascular/Lymphatic: There are no enlarged abdominal or pelvic lymph nodes. No significant vascular findings on noncontrast imaging. Reproductive: The prostate gland and seminal vesicles appear unremarkable. Other: No ascites, free air or focal extraluminal fluid collection. Musculoskeletal: No acute or significant osseous findings. IMPRESSION: 1. Low-density blood pool consistent with known anemia, etiology undetermined. No splenomegaly. 2. No evidence of neoplasm or acute findings in the chest, abdomen or pelvis. Electronically Signed   By: Richardean Sale M.D.   On: 04/27/2021 16:25    Labs:  CBC: Recent Labs    04/26/21 2005 04/27/21 0740 04/27/21 1258 04/28/21 0547  WBC 9.9 8.5 8.3 6.5  HGB 4.9* 7.6* 7.2* 6.9*  HCT 15.8* 22.5* 21.3* 20.1*  PLT 157 109* 109* 115*    COAGS: Recent Labs    04/26/21 2148 04/27/21 0740  INR 1.2 1.1    BMP: Recent Labs    04/26/21 2005 04/27/21 0740 04/28/21 0547  NA 136 134* 137  K 4.1 3.6 3.8  CL 106 104 106  CO2 '26 23 25  ' GLUCOSE 134* 88 89  BUN 26* 19 15  CALCIUM 8.4* 8.4* 8.9  CREATININE 0.75 0.72 0.79  GFRNONAA >60 >60 >60    LIVER FUNCTION TESTS: Recent Labs    04/26/21 2005 04/27/21 0740  BILITOT 0.2* 0.4  AST 18 15  ALT 12 12  ALKPHOS 56 46  PROT 5.5* 5.0*  ALBUMIN 3.2* 3.1*    TUMOR MARKERS: No results for input(s): AFPTM, CEA, CA199, CHROMGRNA in the last 8760 hours.  Assessment and Plan: 20 y.o. male with PMH of asthma, constitutional growth delay, growth hormone deficiency who presented to ED with chief complaint of near syncope, found to have severe anemia with hemoglobin of 4.9. Received blood transfusion and patient was admitted for further evaluation and management of anemia.   Patient was evaluated  by hematology and oncology, who recommended a bone marrow biopsy and aspiration for further evaluation and management of persistent anemia with trending down hemoglobin without known source of bleeding. After thorough discussion and shared decision making, patient decided to proceed with the bone marrow biopsy.  IR was requested for image guided bone marrow biopsy aspiration. N.p.o. since midnight VSS  Risks and benefits of bone marrow biopsy and aspiration was discussed with the patient and/or patient's family including, but not limited to bleeding, infection, damage to adjacent structures or  low yield requiring additional tests.  All of the questions were answered and there is agreement to proceed.  Consent signed and in chart.    Thank you for this interesting consult.  I greatly enjoyed meeting Sean [name] and look forward to participating in their care.  A copy of this report was sent to the requesting provider on this date.  Electronically Signed: Tera Mater, PA-C 04/28/2021, 10:29 AM   I spent a total of 20 Minutes    in face to face in clinical consultation, greater than 50% of which was counseling/coordinating care for bone marrow biopsy and aspiration.

## 2021-04-28 NOTE — Progress Notes (Signed)
[facility] PROGRESS NOTE   Sean [name] KGY:[id number] DOB: 02-19-2001 DOA: 04/26/2021  PCP: Alroy Dust, L.Marlou Sa, MD  Brief History/Interval Summary: 20 y.o. male, with history of asthma, constitutional growth delay, growth hormone deficiency, short stature for age, presented to the ED for chief complaint of near syncope.  Patient apparently has been experiencing generalized weakness and feeling lightheaded for a few days.  He started working at [employer], a [employer], recently.  Apparently they went to urgent care center after he had a near syncopal episode and he was given IV fluids.  She found him paler than usual.  On the day of presentation he was weak and fatigued and once again had a near syncopal episode.  Did not really pass out completely.  Has not noticed any kind of bleeding from any source site recently.  Denies any black stools.  Denies any fever or chills recently.  No acute illness recently.  No previous history of anemia.  No history of sickle cell disease or trait in the family.  Noted to have a hemoglobin of 4.9.  Blood transfusion was initiated and he was hospitalized.  Consultants: Hematology, Dr. Marin Olp  Procedures:  Blood transfusion Bone marrow biopsy has been ordered  Antibiotics: Anti-infectives (From admission, onward)    None       Subjective/Interval History: Patient mentions that he is feeling better today compared to yesterday.  Feels a little stronger.  Has not noticed any bleeding from any site.  Denies any shortness of breath. His mother is at the bedside.     Assessment/Plan:  Severe symptomatic microcytic anemia with near syncope/iron deficiency Presented with a hemoglobin of 4.9 which was likely reason for his fatigue for the several days prior to admission.  Unfortunately we do not have any old CBCs in our system or even through care everywhere.  I called his primary care physician's office who have records from his  pediatrician's office and they also do not have any CBCs in their system. Anemia panel showed ferritin of 16, folate 8.4, B12 367.  Patient's MCV was 76.7.  RDW was normal.  Absolute reticulocyte count was within the normal range.  Fecal occult blood testing was negative.  HIV is negative.  TSH is normal at 1.42. Hematology was consulted as we did not have a clear etiology for his anemia. Patient was given Feraheme. Hemoglobin noted to be 6.9 today.  Will order additional unit of blood.   Appreciate hematology input.  They have ordered additional testing.  A CT scan of the chest abdomen pelvis did not show anything concerning.  Bone marrow biopsy has been ordered by them. Hemoglobin electrophoresis is pending.  Thrombocytopenia Platelets were normal at admission labs but then dropped to subsequently.  Noted to be slightly better today.  No evidence for any bleeding.  Continue to monitor.    History of hypopituitarism/growth hormone deficiency He is followed by pediatric endocrinology.  Last seen by them in May.  His growth hormone injections were discontinued at that time since his growth had ceased which was consistent with fusion of bones on x-ray of hand.  Plan was to repeat IGF 1 and BP 3 in 3 months.   DVT Prophylaxis: SCDs Code Status: Full code Family Communication: Discussed with the patient and his mother Disposition Plan: Hopefully return home when improved.  Status is: Inpatient  Remains inpatient appropriate because:Ongoing diagnostic testing needed not appropriate for outpatient work up  Dispo: The patient is from: Home  Anticipated d/c is to: Home              Patient currently is not medically stable to d/c.   Difficult to place patient No       Medications: Scheduled:  sodium chloride   Intravenous Once   Continuous: YDX:AJOINOMVEHMCN **OR** acetaminophen, albuterol, ondansetron **OR** ondansetron (ZOFRAN) IV, oxyCODONE   Objective:  Vital  Signs  Vitals:   04/27/21 1325 04/27/21 1655 04/27/21 2050 04/28/21 0550  BP: (!) 119/55 116/67 130/60 102/60  Pulse: 82 71 77 71  Resp: '19 19 16 16  ' Temp: 98.6 F (37 C) 99.8 F (37.7 C) 98.8 F (37.1 C) 98.7 F (37.1 C)  TempSrc: Oral Oral Oral Oral  SpO2: 99% 100% 100% 100%  Weight:      Height:        Intake/Output Summary (Last 24 hours) at 04/28/2021 0920 Last data filed at 04/27/2021 1825 Gross per 24 hour  Intake 480 ml  Output --  Net 480 ml    Filed Weights   04/26/21 1957  Weight: 52.2 kg    General appearance: Awake alert.  In no distress.  Short stature. Resp: Clear to auscultation bilaterally.  Normal effort Cardio: S1-S2 is normal regular.  No S3-S4.  No rubs murmurs or bruit GI: Abdomen is soft.  Nontender nondistended.  Bowel sounds are present normal.  No masses organomegaly Extremities: No edema.  Full range of motion of lower extremities. Neurologic: Alert and oriented x3.  No focal neurological deficits.      Lab Results:  Data Reviewed: I have personally reviewed following labs and imaging studies  CBC: Recent Labs  Lab 04/26/21 2005 04/27/21 0740 04/27/21 1258 04/28/21 0547  WBC 9.9 8.5 8.3 6.5  NEUTROABS 6.2  --   --   --   HGB 4.9* 7.6* 7.2* 6.9*  HCT 15.8* 22.5* 21.3* 20.1*  MCV 76.7* 80.6 81.3 81.7  PLT 157 109* 109* 115*     Basic Metabolic Panel: Recent Labs  Lab 04/26/21 2005 04/27/21 0740 04/28/21 0547  NA 136 134* 137  K 4.1 3.6 3.8  CL 106 104 106  CO2 '26 23 25  ' GLUCOSE 134* 88 89  BUN 26* 19 15  CREATININE 0.75 0.72 0.79  CALCIUM 8.4* 8.4* 8.9  MG  --  1.7  --      GFR: Estimated Creatinine Clearance: 109.7 mL/min (by C-G formula based on SCr of 0.79 mg/dL).  Liver Function Tests: Recent Labs  Lab 04/26/21 2005 04/27/21 0740  AST 18 15  ALT 12 12  ALKPHOS 56 46  BILITOT 0.2* 0.4  PROT 5.5* 5.0*  ALBUMIN 3.2* 3.1*     Recent Labs  Lab 04/26/21 2148  LIPASE 29      Coagulation  Profile: Recent Labs  Lab 04/26/21 2148 04/27/21 0740  INR 1.2 1.1      Thyroid Function Tests: Recent Labs    04/27/21 0740  TSH 1.420     Anemia Panel: Recent Labs    04/26/21 2120 04/26/21 2148 04/28/21 0547  VITAMINB12  --  367  --   FOLATE  --  8.4  --   FERRITIN  --  16*  --   TIBC  --  251  --   IRON  --  82  --   RETICCTPCT 4.0*  --  4.8*     Recent Results (from the past 240 hour(s))  Resp Panel by RT-PCR (Flu A&B, Covid) Nasopharyngeal Swab  Status: None   Collection Time: 04/26/21 10:01 PM   Specimen: Nasopharyngeal Swab; Nasopharyngeal(NP) swabs in vial transport medium  Result Value Ref Range Status   SARS Coronavirus 2 by RT PCR NEGATIVE NEGATIVE Final    Comment: (NOTE) SARS-CoV-2 target nucleic acids are NOT DETECTED.  The SARS-CoV-2 RNA is generally detectable in upper respiratory specimens during the acute phase of infection. The lowest concentration of SARS-CoV-2 viral copies this assay can detect is 138 copies/mL. A negative result does not preclude SARS-Cov-2 infection and should not be used as the sole basis for treatment or other patient management decisions. A negative result may occur with  improper specimen collection/handling, submission of specimen other than nasopharyngeal swab, presence of viral mutation(s) within the areas targeted by this assay, and inadequate number of viral copies(<138 copies/mL). A negative result must be combined with clinical observations, patient history, and epidemiological information. The expected result is Negative.  Fact Sheet for Patients:  EntrepreneurPulse.com.au  Fact Sheet for Healthcare Providers:  IncredibleEmployment.be  This test is no t yet approved or cleared by the [place] FDA and  has been authorized for detection and/or diagnosis of SARS-CoV-2 by FDA under an Emergency Use Authorization (EUA). This EUA will remain  in effect (meaning  this test can be used) for the duration of the COVID-19 declaration under Section 564(b)(1) of the Act, 21 U.S.C.section 360bbb-3(b)(1), unless the authorization is terminated  or revoked sooner.       Influenza A by PCR NEGATIVE NEGATIVE Final   Influenza B by PCR NEGATIVE NEGATIVE Final    Comment: (NOTE) The Xpert Xpress SARS-CoV-2/FLU/RSV plus assay is intended as an aid in the diagnosis of influenza from Nasopharyngeal swab specimens and should not be used as a sole basis for treatment. Nasal washings and aspirates are unacceptable for Xpert Xpress SARS-CoV-2/FLU/RSV testing.  Fact Sheet for Patients: EntrepreneurPulse.com.au  Fact Sheet for Healthcare Providers: IncredibleEmployment.be  This test is not yet approved or cleared by the [place] FDA and has been authorized for detection and/or diagnosis of SARS-CoV-2 by FDA under an Emergency Use Authorization (EUA). This EUA will remain in effect (meaning this test can be used) for the duration of the COVID-19 declaration under Section 564(b)(1) of the Act, 21 U.S.C. section 360bbb-3(b)(1), unless the authorization is terminated or revoked.  Performed at [facility], [place] [address] [place], [place] [address]        Radiology Studies: CT CHEST ABDOMEN PELVIS WO CONTRAST  Result Date: 04/27/2021 CLINICAL DATA:  Recurrent near syncopal episodes with anemia of unknown etiology. History of asthma and constitutional growth 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 EXAM: CT CHEST, ABDOMEN AND PELVIS WITHOUT CONTRAST TECHNIQUE: Multidetector CT imaging of the chest, abdomen and pelvis was performed following the standard protocol without IV contrast. COMPARISON:  Scrotal ultrasound 10/07/2015 FINDINGS: CT CHEST FINDINGS Cardiovascular: No significant vascular findings on noncontrast imaging. Decreased density of the blood pool consistent with  anemia. The heart size is normal. There is no pericardial effusion. Mediastinum/Nodes: There are no enlarged mediastinal, hilar or axillary lymph nodes. Hilar assessment is limited by the lack of intravenous contrast. Residual thymic tissue in the anterior mediastinum, typical for age. The thyroid gland, trachea and esophagus demonstrate no significant findings. Lungs/Pleura: There is no pleural effusion. 4 mm ground-glass nodule in the left lower lobe on image 106/6, likely a lymph node or focus of inflammation. Per consensus guidelines, this requires no specific follow-up. The lungs are otherwise clear. Musculoskeletal/Chest wall:  No chest wall mass or suspicious osseous findings. CT ABDOMEN AND PELVIS FINDINGS Hepatobiliary: The liver appears unremarkable as imaged in the noncontrast state. No evidence of gallstones, gallbladder wall thickening or biliary dilatation. Pancreas: Unremarkable. No pancreatic ductal dilatation or surrounding inflammatory changes. Spleen: Normal in size without focal abnormality. Adrenals/Urinary Tract: Both adrenal glands appear normal. Both kidneys appear unremarkable on noncontrast imaging. No evidence of urinary tract calculus or hydronephrosis. The bladder appears normal. Stomach/Bowel: Enteric contrast was administered and has passed into the mid to distal small bowel. The stomach appears unremarkable for its degree of distension. No evidence of bowel wall thickening, distention or surrounding inflammatory change. The appendix appears normal. Moderate stool throughout the colon. Vascular/Lymphatic: There are no enlarged abdominal or pelvic lymph nodes. No significant vascular findings on noncontrast imaging. Reproductive: The prostate gland and seminal vesicles appear unremarkable. Other: No ascites, free air or focal extraluminal fluid collection. Musculoskeletal: No acute or significant osseous findings. IMPRESSION: 1. Low-density blood pool consistent with known anemia,  etiology undetermined. No splenomegaly. 2. No evidence of neoplasm or acute findings in the chest, abdomen or pelvis. Electronically Signed   By: Richardean Sale M.D.   On: 04/27/2021 16:25       LOS: 1 day   [facility] Pager on [email or web address]  04/28/2021, 9:20 AM

## 2021-04-28 NOTE — Progress Notes (Signed)
Date and time results received: 04/28/21 0637am  Test: HGB Critical Value: 6.9  Name of Provider Notified: Dow Adolph  Orders Received? Or Actions Taken?: Pending

## 2021-04-28 NOTE — Procedures (Signed)
Interventional Radiology Procedure Note  Procedure: CT guided aspirate and core biopsy of right iliac bone Complications: None Recommendations: - Bedrest supine x 1 hrs - Hydrocodone PRN  Pain - Follow biopsy results  Signed,  Samella Lucchetti K. Kemiyah Tarazon, MD   

## 2021-04-29 LAB — RETICULOCYTES
Immature Retic Fract: 42 % — ABNORMAL HIGH (ref 2.3–15.9)
RBC.: 2.99 MIL/uL — ABNORMAL LOW (ref 4.22–5.81)
Retic Count, Absolute: 173.7 10*3/uL (ref 19.0–186.0)
Retic Ct Pct: 5.8 % — ABNORMAL HIGH (ref 0.4–3.1)

## 2021-04-29 LAB — CBC WITH DIFFERENTIAL/PLATELET
Abs Immature Granulocytes: 0.12 10*3/uL — ABNORMAL HIGH (ref 0.00–0.07)
Basophils Absolute: 0 10*3/uL (ref 0.0–0.1)
Basophils Relative: 0 %
Eosinophils Absolute: 0.3 10*3/uL (ref 0.0–0.5)
Eosinophils Relative: 4 %
HCT: 25.6 % — ABNORMAL LOW (ref 39.0–52.0)
Hemoglobin: 8.3 g/dL — ABNORMAL LOW (ref 13.0–17.0)
Immature Granulocytes: 2 %
Lymphocytes Relative: 28 %
Lymphs Abs: 1.7 10*3/uL (ref 0.7–4.0)
MCH: 27.4 pg (ref 26.0–34.0)
MCHC: 32.4 g/dL (ref 30.0–36.0)
MCV: 84.5 fL (ref 80.0–100.0)
Monocytes Absolute: 0.5 10*3/uL (ref 0.1–1.0)
Monocytes Relative: 9 %
Neutro Abs: 3.4 10*3/uL (ref 1.7–7.7)
Neutrophils Relative %: 57 %
Platelets: 140 10*3/uL — ABNORMAL LOW (ref 150–400)
RBC: 3.03 MIL/uL — ABNORMAL LOW (ref 4.22–5.81)
RDW: 17.9 % — ABNORMAL HIGH (ref 11.5–15.5)
WBC: 6 10*3/uL (ref 4.0–10.5)
nRBC: 0 % (ref 0.0–0.2)

## 2021-04-29 LAB — TYPE AND SCREEN
ABO/RH(D): O POS
Antibody Screen: NEGATIVE
Unit division: 0
Unit division: 0
Unit division: 0

## 2021-04-29 LAB — BPAM RBC
Blood Product Expiration Date: 202207152359
Blood Product Expiration Date: 202207152359
Blood Product Expiration Date: 202207152359
ISSUE DATE / TIME: 202206122314
ISSUE DATE / TIME: 202206130248
ISSUE DATE / TIME: 202206141339
Unit Type and Rh: 5100
Unit Type and Rh: 5100
Unit Type and Rh: 5100

## 2021-04-29 LAB — HGB FRACTIONATION CASCADE
Hgb A2: 2.4 % (ref 1.8–3.2)
Hgb A2: 2.4 % (ref 1.8–3.2)
Hgb A: 97.6 % (ref 96.4–98.8)
Hgb A: 97.6 % (ref 96.4–98.8)
Hgb F: 0 % (ref 0.0–2.0)
Hgb F: 0 % (ref 0.0–2.0)
Hgb S: 0 %
Hgb S: 0 %

## 2021-04-29 LAB — PHOSPHORUS: Phosphorus: 4.7 mg/dL — ABNORMAL HIGH (ref 2.5–4.6)

## 2021-04-29 LAB — ERYTHROPOIETIN: Erythropoietin: 30.5 m[IU]/mL — ABNORMAL HIGH (ref 2.6–18.5)

## 2021-04-29 LAB — LACTATE DEHYDROGENASE: LDH: 98 U/L (ref 98–192)

## 2021-04-29 NOTE — Progress Notes (Signed)
PROGRESS NOTE    Sean [name]  STM:[id number] DOB: 20-Jul-2001 DOA: 04/26/2021 PCP: Alroy Dust, L.Marlou Sa, MD   Chief Complaint  Patient presents with   Weakness    Brief Narrative:  20 y.o. male, with history of asthma, constitutional growth delay, growth hormone deficiency, short stature for age, presented to the ED for chief complaint of near syncope.  Patient apparently has been experiencing generalized weakness and feeling lightheaded for Sean [name] few days.  He started working at [employer], [employer], recently.  Apparently they went to urgent care center after he had Sean [name] near syncopal episode and he was given IV fluids.  She found him paler than usual.  On the day of presentation he was weak and fatigued and once again had [name] near syncopal episode.  Did not really pass out completely.  Has not noticed any kind of bleeding from any source site recently.  Denies any black stools.  Denies any fever or chills recently.  No acute illness recently.  No previous history of anemia.  No history of sickle cell disease or trait in the family.  Noted to have Sean [name] hemoglobin of 4.9.  Blood transfusion was initiated and he was hospitalized.  Assessment & Plan:   Principal Problem:   Symptomatic anemia Active Problems:   Growth hormone deficiency (HCC)   Mild protein-calorie malnutrition (HCC)   Near syncope  Severe symptomatic microcytic anemia with near syncope/iron deficiency Hb 4.9 at presentation S/p 3 units pRBC 8.3 today Labs c/w iron def anemia S/p feraheme S/p right iliac aspiration and core marrow biopsy Hgb fractionation cascade/electrophoresis with normal hemoglobin, no hemoglobin variant or beta thalassemia (alpha thal may not be detected by Hbg fractionation cascate panel) Heme c/s, appreciate assistance  Thrombocytopenia Mild, continue to monitor, follow bx  History of hypopituitarism/growth hormone deficiency He is followed by pediatric endocrinology.  Last seen by them in  May.  His growth hormone injections were discontinued at that time since his growth had ceased which was consistent with fusion of bones on x-ray of hand.  Plan was to repeat IGF 1 and BP 3 in 3 months.  DVT prophylaxis: SCD Code Status: full Family Communication: parents at bedside Disposition:   Status is: Inpatient  Remains inpatient appropriate because:Inpatient level of care appropriate due to severity of illness  Dispo: The patient is from: Home              Anticipated d/c is to: Home              Patient currently is not medically stable to d/c.   Difficult to place patient No       Consultants:  IR heme  Procedures:   CT bx   IMPRESSION: Successful right iliac aspiration and core marrow biopsy.  Antimicrobials: Anti-infectives (From admission, onward)    None          Subjective: No complaints  Objective: Vitals:   04/28/21 1603 04/28/21 2155 04/29/21 0448 04/29/21 1423  BP: (!) 120/58 109/62 (!) 107/59 (!) 114/57  Pulse: 63 65 67 71  Resp: '16 14 14 18  ' Temp: 98.2 F (36.8 C) 98.8 F (37.1 C) 98.8 F (37.1 C) 98.8 F (37.1 C)  TempSrc: Oral Oral Oral Oral  SpO2: 99% 100% 100% 100%  Weight:      Height:        Intake/Output Summary (Last 24 hours) at 04/29/2021 1830 Last data filed at 04/29/2021 0943 Gross per 24 hour  Intake 345.67  ml  Output --  Net 345.67 ml   Filed Weights   04/26/21 1957  Weight: 52.2 kg    Examination:  General exam: Appears calm and comfortable  Respiratory system: Clear to auscultation. Respiratory effort normal. Cardiovascular system: S1 & S2 heard, RRR.  Gastrointestinal system: Abdomen is nondistended, soft and nontender. Central nervous system: Alert and oriented. No focal neurological deficits. Extremities: no LEE Skin: No rashes, lesions or ulcers Psychiatry: Judgement and insight appear normal. Mood & affect appropriate.     Data Reviewed: I have personally reviewed following labs and imaging  studies  CBC: Recent Labs  Lab 04/26/21 2005 04/27/21 0740 04/27/21 1258 04/28/21 0547 04/29/21 1019  WBC 9.9 8.5 8.3 6.5 6.0  NEUTROABS 6.2  --   --   --  3.4  HGB 4.9* 7.6* 7.2* 6.9* 8.3*  HCT 15.8* 22.5* 21.3* 20.1* 25.6*  MCV 76.7* 80.6 81.3 81.7 84.5  PLT 157 109* 109* 115* 140*    Basic Metabolic Panel: Recent Labs  Lab 04/26/21 2005 04/27/21 0740 04/28/21 0547  NA 136 134* 137  K 4.1 3.6 3.8  CL 106 104 106  CO2 '26 23 25  ' GLUCOSE 134* 88 89  BUN 26* 19 15  CREATININE 0.75 0.72 0.79  CALCIUM 8.4* 8.4* 8.9  MG  --  1.7  --     GFR: Estimated Creatinine Clearance: 109.7 mL/min (by C-G formula based on SCr of 0.79 mg/dL).  Liver Function Tests: Recent Labs  Lab 04/26/21 2005 04/27/21 0740  AST 18 15  ALT 12 12  ALKPHOS 56 46  BILITOT 0.2* 0.4  PROT 5.5* 5.0*  ALBUMIN 3.2* 3.1*    CBG: No results for input(s): GLUCAP in the last 168 hours.   Recent Results (from the past 240 hour(s))  Resp Panel by RT-PCR (Flu Kolette Vey&B, Covid) Nasopharyngeal Swab     Status: None   Collection Time: 04/26/21 10:01 PM   Specimen: Nasopharyngeal Swab; Nasopharyngeal(NP) swabs in vial transport medium  Result Value Ref Range Status   SARS Coronavirus 2 by RT PCR NEGATIVE NEGATIVE Final    Comment: (NOTE) SARS-CoV-2 target nucleic acids are NOT DETECTED.  The SARS-CoV-2 RNA is generally detectable in upper respiratory specimens during the acute phase of infection. The lowest concentration of SARS-CoV-2 viral copies this assay can detect is 138 copies/mL. Sean [name] negative result does not preclude SARS-Cov-2 infection and should not be used as the sole basis for treatment or other patient management decisions. Sean [name] negative result may occur with  improper specimen collection/handling, submission of specimen other than nasopharyngeal swab, presence of viral mutation(s) within the areas targeted by this assay, and inadequate number of viral copies(<138 copies/mL). Sean [name] negative result  must be combined with clinical observations, patient history, and epidemiological information. The expected result is Negative.  Fact Sheet for Patients:  EntrepreneurPulse.com.au  Fact Sheet for Healthcare Providers:  IncredibleEmployment.be  This test is no t yet approved or cleared by the [place] FDA and  has been authorized for detection and/or diagnosis of SARS-CoV-2 by FDA under an Emergency Use Authorization (EUA). This EUA will remain  in effect (meaning this test can be used) for the duration of the COVID-19 declaration under Section 564(b)(1) of the Act, 21 U.S.C.section 360bbb-3(b)(1), unless the authorization is terminated  or revoked sooner.       Influenza Jorie Zee by PCR NEGATIVE NEGATIVE Final   Influenza B by PCR NEGATIVE NEGATIVE Final    Comment: (NOTE) The Xpert Xpress SARS-CoV-2/FLU/RSV plus assay  is intended as an aid in the diagnosis of influenza from Nasopharyngeal swab specimens and should not be used as Elia Keenum sole basis for treatment. Nasal washings and aspirates are unacceptable for Xpert Xpress SARS-CoV-2/FLU/RSV testing.  Fact Sheet for Patients: EntrepreneurPulse.com.au  Fact Sheet for Healthcare Providers: IncredibleEmployment.be  This test is not yet approved or cleared by the [place] FDA and has been authorized for detection and/or diagnosis of SARS-CoV-2 by FDA under an Emergency Use Authorization (EUA). This EUA will remain in effect (meaning this test can be used) for the duration of the COVID-19 declaration under Section 564(b)(1) of the Act, 21 U.S.C. section 360bbb-3(b)(1), unless the authorization is terminated or revoked.  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 ANESTHESIA/SEDATION: Moderate (conscious) sedation was employed during this procedure. Khalessi Blough total of 2 milligrams versed and 100 micrograms fentanyl were administered intravenously. The patient's level of consciousness and vital signs were monitored continuously by radiology nursing throughout the procedure under my direct supervision. Total monitored sedation time: 10 minutes FLUOROSCOPY TIME:  None COMPLICATIONS: None immediate. Estimated blood loss: <25 mL PROCEDURE: Informed written consent was obtained from the patient after Sher Hellinger thorough discussion of the procedural risks, benefits and alternatives. All questions were addressed. Maximal Sterile Barrier Technique was utilized including caps, mask, sterile gowns, sterile gloves, sterile drape, hand hygiene and skin antiseptic. Anabella Capshaw timeout was performed prior to the initiation of the procedure. The patient was positioned prone and non-contrast localization CT was performed of the pelvis to demonstrate the iliac marrow spaces. Maximal barrier sterile technique utilized including caps, mask, sterile gowns, sterile gloves, large sterile drape, hand hygiene, and betadine prep. Under sterile conditions and local anesthesia, an 11 gauge coaxial bone biopsy needle was advanced into the right iliac marrow space. Needle position was confirmed with CT imaging. Initially, bone marrow aspiration was performed. Next, the 11 gauge outer cannula was utilized to obtain Sarita Hakanson right iliac bone marrow core biopsy. Needle was removed. Hemostasis was obtained with compression. The patient tolerated the procedure well. Samples were prepared with the cytotechnologist. IMPRESSION: Successful right iliac aspiration and core marrow biopsy. Electronically Signed   By: Jacqulynn Cadet M.D.   On: 05/23/2021 13:07   CT BONE MARROW BIOPSY & ASPIRATION  Result  Date: 23-May-2021 INDICATION: 20 year old male with profound anemia concerning for possible red cell aplasia EXAM: CT GUIDED BONE MARROW ASPIRATION AND CORE BIOPSY Interventional Radiologist:  Criselda Peaches, MD MEDICATIONS: None. ANESTHESIA/SEDATION: Moderate (conscious) sedation was employed during this procedure. Errin Whitelaw total of 2 milligrams versed and 100 micrograms fentanyl were administered intravenously. The patient's level of consciousness and vital signs were monitored continuously by radiology nursing throughout the procedure under my direct supervision. Total monitored sedation time: 10 minutes FLUOROSCOPY TIME:  None COMPLICATIONS: None immediate. Estimated blood loss: <25 mL PROCEDURE: Informed written consent was obtained from the patient after [name] Littler thorough discussion of the procedural risks, benefits and alternatives. All questions were addressed. Maximal Sterile Barrier Technique was utilized including caps, mask, sterile gowns, sterile gloves, sterile drape, hand hygiene and skin antiseptic. Blimy Napoleon timeout was performed prior to the initiation of the procedure. The patient was positioned prone and non-contrast localization CT was performed of  the pelvis to demonstrate the iliac marrow spaces. Maximal barrier sterile technique utilized including caps, mask, sterile gowns, sterile gloves, large sterile drape, hand hygiene, and betadine prep. Under sterile conditions and local anesthesia, an 11 gauge coaxial bone biopsy needle was advanced into the right iliac marrow space. Needle position was confirmed with CT imaging. Initially, bone marrow aspiration was performed. Next, the 11 gauge outer cannula was utilized to obtain Judith Demps right iliac bone marrow core biopsy. Needle was removed. Hemostasis was obtained with compression. The patient tolerated the procedure well. Samples were prepared with the cytotechnologist. IMPRESSION: Successful right iliac aspiration and core marrow biopsy. Electronically Signed   By:  Jacqulynn Cadet M.D.   On: 04/28/2021 13:07        Scheduled Meds:  sodium chloride   Intravenous Once   Continuous Infusions:   LOS: 2 days    Time spent: over 30 min    Fayrene Helper, MD [facility]   To contact the attending provider between 7A-7P or the covering provider during after hours 7P-7A, please log into the web site [email or web address] and access using universal [place] password for that web site. If you do not have the password, please call the hospital operator.  04/29/2021, 6:30 PM

## 2021-04-29 NOTE — Progress Notes (Signed)
Sean [name] did have the bone marrow biopsy done yesterday.  Hopefully, there will be some results back tomorrow.  There is no CBC yet today.  He does have a cardiac murmur.  I do not think he has sharing of red blood cells.  However, I think an echocardiogram would not be a bad idea.  He has had no fever.  His appetite is doing well.  The rest of the anemia studies have not yet come back.  Para he was iron deficient.  Again I am not sure why he would be iron deficient unless he just does have some element of malabsorption.  I saw to believe that this whole developmental issue that he has might be a factor.  There is no evidence of any malignancy.  His testicular markers are all normal.  Again, I have to believe that the bone marrow will give [place] an idea as to what is going on.  His vital signs show temperature 98.8.  Pulse 67.  Blood pressure 107/59.  There is nothing on his physical exam that is remarkable.  He does have a 2/6 systolic ejection murmur.  There is no hepatomegaly or splenomegaly.  I cannot palpate any adenopathy.  He has no rash.  We will just have to see what the bone marrow shows.  We will have to send off CBC today.  I know this is somewhat complicated.  I do appreciate the great care that he is getting from all the staff up on 6 E.  Lattie Haw, MD  Jeneen Rinks 1:5-7

## 2021-04-30 ENCOUNTER — Inpatient Hospital Stay (HOSPITAL_COMMUNITY): Payer: 59

## 2021-04-30 DIAGNOSIS — R011 Cardiac murmur, unspecified: Secondary | ICD-10-CM

## 2021-04-30 LAB — ECHOCARDIOGRAM COMPLETE
AR max vel: 3.28 cm2
AV Area VTI: 2.78 cm2
AV Area mean vel: 2.43 cm2
AV Mean grad: 9 mmHg
AV Peak grad: 16.8 mmHg
Ao pk vel: 2.05 m/s
Area-P 1/2: 4.36 cm2
Calc EF: 72.6 %
Height: 64 in
S' Lateral: 1.9 cm
Single Plane A2C EF: 65.2 %
Single Plane A4C EF: 79.1 %
Weight: 1840 oz

## 2021-04-30 LAB — CBC WITH DIFFERENTIAL/PLATELET
Abs Immature Granulocytes: 0.11 10*3/uL — ABNORMAL HIGH (ref 0.00–0.07)
Basophils Absolute: 0 10*3/uL (ref 0.0–0.1)
Basophils Relative: 0 %
Eosinophils Absolute: 0.2 10*3/uL (ref 0.0–0.5)
Eosinophils Relative: 4 %
HCT: 25.5 % — ABNORMAL LOW (ref 39.0–52.0)
Hemoglobin: 8.4 g/dL — ABNORMAL LOW (ref 13.0–17.0)
Immature Granulocytes: 2 %
Lymphocytes Relative: 18 %
Lymphs Abs: 1.1 10*3/uL (ref 0.7–4.0)
MCH: 28 pg (ref 26.0–34.0)
MCHC: 32.9 g/dL (ref 30.0–36.0)
MCV: 85 fL (ref 80.0–100.0)
Monocytes Absolute: 0.5 10*3/uL (ref 0.1–1.0)
Monocytes Relative: 9 %
Neutro Abs: 4.1 10*3/uL (ref 1.7–7.7)
Neutrophils Relative %: 67 %
Platelets: 159 10*3/uL (ref 150–400)
RBC: 3 MIL/uL — ABNORMAL LOW (ref 4.22–5.81)
RDW: 18.4 % — ABNORMAL HIGH (ref 11.5–15.5)
WBC: 6.1 10*3/uL (ref 4.0–10.5)
nRBC: 0 % (ref 0.0–0.2)

## 2021-04-30 LAB — COMPREHENSIVE METABOLIC PANEL
ALT: 24 U/L (ref 0–44)
AST: 25 U/L (ref 15–41)
Albumin: 3.2 g/dL — ABNORMAL LOW (ref 3.5–5.0)
Alkaline Phosphatase: 52 U/L (ref 38–126)
Anion gap: 6 (ref 5–15)
BUN: 18 mg/dL (ref 6–20)
CO2: 24 mmol/L (ref 22–32)
Calcium: 8.8 mg/dL — ABNORMAL LOW (ref 8.9–10.3)
Chloride: 108 mmol/L (ref 98–111)
Creatinine, Ser: 0.81 mg/dL (ref 0.61–1.24)
GFR, Estimated: >60 mL/min (ref >60)
Glucose, Bld: 92 mg/dL (ref 70–99)
Potassium: 4.1 mmol/L (ref 3.5–5.1)
Sodium: 138 mmol/L (ref 135–145)
Total Bilirubin: 0.2 mg/dL — ABNORMAL LOW (ref 0.3–1.2)
Total Protein: 5.5 g/dL — ABNORMAL LOW (ref 6.5–8.1)

## 2021-04-30 LAB — RETICULOCYTES
Immature Retic Fract: 42.3 % — ABNORMAL HIGH (ref 2.3–15.9)
RBC.: 2.97 MIL/uL — ABNORMAL LOW (ref 4.22–5.81)
Retic Count, Absolute: 197.2 10*3/uL — ABNORMAL HIGH (ref 19.0–186.0)
Retic Ct Pct: 6.6 % — ABNORMAL HIGH (ref 0.4–3.1)

## 2021-04-30 LAB — MAGNESIUM: Magnesium: 2 mg/dL (ref 1.7–2.4)

## 2021-04-30 LAB — COLD AGGLUTININ TITER: Cold Agglutinin Titer: NEGATIVE

## 2021-04-30 LAB — LACTATE DEHYDROGENASE: LDH: 91 U/L — ABNORMAL LOW (ref 98–192)

## 2021-04-30 LAB — PHOSPHORUS: Phosphorus: 4.7 mg/dL — ABNORMAL HIGH (ref 2.5–4.6)

## 2021-04-30 MED ORDER — SODIUM CHLORIDE 0.9 % IV SOLN
510.0000 mg | Freq: Once | INTRAVENOUS | Status: AC
  Administered 2021-04-30: 510 mg via INTRAVENOUS
  Filled 2021-04-30: qty 510

## 2021-04-30 MED ORDER — FOLIC ACID 1 MG PO TABS
2.0000 mg | ORAL_TABLET | Freq: Every day | ORAL | Status: DC
  Administered 2021-04-30 – 2021-05-01 (×2): 2 mg via ORAL
  Filled 2021-04-30 (×2): qty 2

## 2021-04-30 NOTE — Progress Notes (Signed)
We still do not have the bone marrow biopsy back yet.  Hopefully this will come back today.  At least, his hemoglobin is holding steady.  Today's hemoglobin is 8.4.  His platelet count is also getting better.  It is now 159,000.  I doubt that he has red cell aplasia.  His reticulocyte count is better.  It keeps going up.  I just wonder if the iron did help him.  I will go ahead and give him another dose of IV iron.  I will also put him on some folic acid.  Maybe, this is some, nutritional issue.  It is possible he just may not absorb iron.  His erythropoietin level is only 30.7.  This certainly is low for his element of anemia.  There is no bleeding.  He is having no nausea or vomiting.  He is out of bed.  Again, I suspect now that the bone marrow biopsy probably will not show [place] anything significant.  Again, I just think that there is some correlation with the anemia and his developmental issues.  Hopefully, he will be able to go home soon.  He has not yet had echocardiogram.  He does have a cardiac murmur.  It would be nice to see what this murmur is.  There is really no change in his overall physical exam.  All of his vital signs look stable.  Temperature 98.4.  Pulse 63.  Blood pressure 113/54.  I do appreciate the great care that he is gotten from all staff up on 6 E.  I know they have done a great job with him.  I appreciate their compassion.  Lattie Haw, MD  Penelope Coop 5:25

## 2021-04-30 NOTE — Progress Notes (Signed)
*  PRELIMINARY RESULTS* Echocardiogram 2D Echocardiogram has been performed.  Neomia Dear RDCS 04/30/2021, 2:10 PM

## 2021-04-30 NOTE — Progress Notes (Signed)
PROGRESS NOTE    Juliet Rudemmanuel O Vonstein  ZOX:[id number] DOB: 03/03/2001 DOA: 04/26/2021 PCP: Clovis RileyMitchell, L.August Saucerean, MD   Chief Complaint  Patient presents with   Weakness    Brief Narrative:  20 y.o. male, with history of asthma, constitutional growth delay, growth hormone deficiency, short stature for age, presented to the ED for chief complaint of near syncope.  Patient apparently has been experiencing generalized weakness and feeling lightheaded for Nasean Zapf few days.  He started working at [employer], recently.  Apparently they went to urgent care center after he had Zeah Germano near syncopal episode and he was given IV fluids.  She found him paler than usual.  On the day of presentation he was weak and fatigued and once again had Tyler Cubit near syncopal episode.  Did not really pass out completely.  Has not noticed any kind of bleeding from any source site recently.  Denies any black stools.  Denies any fever or chills recently.  No acute illness recently.  No previous history of anemia.  No history of sickle cell disease or trait in the family.  Noted to have Ezekiah Massie hemoglobin of 4.9.  Blood transfusion was initiated and he was hospitalized.  Assessment & Plan:   Principal Problem:   Symptomatic anemia Active Problems:   Growth hormone deficiency (HCC)   Mild protein-calorie malnutrition (HCC)   Near syncope  Severe symptomatic microcytic anemia with near syncope/iron deficiency Hb 4.9 at presentation S/p 3 units pRBC 8.3 today Labs c/w iron def anemia S/p feraheme S/p right iliac aspiration and core marrow biopsy - hypocellular marrow with erythroid hyperplacia, normocytic anemia, mild thrombocytopenia Hgb fractionation cascade/electrophoresis with normal hemoglobin, no hemoglobin variant or beta thalassemia (alpha thal may not be detected by Hbg fractionation cascate panel) Heme c/s, appreciate assistance  Thrombocytopenia resolved  History of hypopituitarism/growth hormone deficiency He  is followed by pediatric endocrinology.  Last seen by them in May.  His growth hormone injections were discontinued at that time since his growth had ceased which was consistent with fusion of bones on x-ray of hand.  Plan was to repeat IGF 1 and BP 3 in 3 months.  Systolic murmur Echo with mild to moderate AV sclerosis/calcification  DVT prophylaxis: SCD Code Status: full Family Communication: parents at bedside Disposition:   Status is: Inpatient  Remains inpatient appropriate because:Inpatient level of care appropriate due to severity of illness  Dispo: The patient is from: Home              Anticipated d/c is to: Home              Patient currently is not medically stable to d/c.   Difficult to place patient No       Consultants:  IR heme  Procedures:   Echo IMPRESSIONS     1. Left ventricular ejection fraction, by estimation, is 60 to 65%. The  left ventricle has normal function. The left ventricle has no regional  wall motion abnormalities. Left ventricular diastolic parameters were  normal.   2. Right ventricular systolic function is normal. The right ventricular  size is normal. There is normal pulmonary artery systolic pressure. The  estimated right ventricular systolic pressure is 20.0 mmHg.   3. Left atrial size was mildly dilated.   4. The mitral valve is normal in structure. Trivial mitral valve  regurgitation. No evidence of mitral stenosis.   5. The aortic valve is tricuspid. Aortic valve regurgitation is not  visualized. Mild to  moderate aortic valve sclerosis/calcification is  present, without any evidence of aortic stenosis.   6. The inferior vena cava is normal in size with greater than 50%  respiratory variability, suggesting right atrial pressure of 3 mmHg.    CT bx   IMPRESSION: Successful right iliac aspiration and core marrow biopsy.  Antimicrobials: Anti-infectives (From admission, onward)    None          Subjective: No  complaints  Objective: Vitals:   04/29/21 2056 04/30/21 0441 04/30/21 0958 04/30/21 1315  BP: 110/62 (!) 113/54 113/62 117/75  Pulse: (!) 59 63 72 80  Resp: 16 14 19 15   Temp: 98.8 F (37.1 C) 98.4 F (36.9 C) 99.3 F (37.4 C) 99 F (37.2 C)  TempSrc: Oral Oral Oral Oral  SpO2: 100% 100% 99% 100%  Weight:      Height:        Intake/Output Summary (Last 24 hours) at 04/30/2021 1644 Last data filed at 04/30/2021 1500 Gross per 24 hour  Intake 613.19 ml  Output --  Net 613.19 ml   Filed Weights   04/26/21 1957  Weight: 52.2 kg    Examination:  General: No acute distress. Cardiovascular: Heart sounds show Shalaya Swailes regular rate, and rhythm.  Lungs: Clear to auscultation bilaterally  Abdomen: Soft, nontender, nondistended  Neurological: Alert and oriented 3. Moves all extremities 4 . Cranial nerves II through XII grossly intact. Skin: Warm and dry. No rashes or lesions. Extremities: No clubbing or cyanosis. No edema  Data Reviewed: I have personally reviewed following labs and imaging studies  CBC: Recent Labs  Lab 04/26/21 2005 04/27/21 0740 04/27/21 1258 04/28/21 0547 04/29/21 1019 04/30/21 0535  WBC 9.9 8.5 8.3 6.5 6.0 6.1  NEUTROABS 6.2  --   --   --  3.4 4.1  HGB 4.9* 7.6* 7.2* 6.9* 8.3* 8.4*  HCT 15.8* 22.5* 21.3* 20.1* 25.6* 25.5*  MCV 76.7* 80.6 81.3 81.7 84.5 85.0  PLT 157 109* 109* 115* 140* 159    Basic Metabolic Panel: Recent Labs  Lab 04/26/21 2005 04/27/21 0740 04/28/21 0547 04/29/21 2152 04/30/21 0535  NA 136 134* 137  --  138  K 4.1 3.6 3.8  --  4.1  CL 106 104 106  --  108  CO2 26 23 25   --  24  GLUCOSE 134* 88 89  --  92  BUN 26* 19 15  --  18  CREATININE 0.75 0.72 0.79  --  0.81  CALCIUM 8.4* 8.4* 8.9  --  8.8*  MG  --  1.7  --   --  2.0  PHOS  --   --   --  4.7*  --     GFR: Estimated Creatinine Clearance: 108.3 mL/min (by C-G formula based on SCr of 0.81 mg/dL).  Liver Function Tests: Recent Labs  Lab 04/26/21 2005  04/27/21 0740 04/30/21 0535  AST 18 15 25   ALT 12 12 24   ALKPHOS 56 46 52  BILITOT 0.2* 0.4 0.2*  PROT 5.5* 5.0* 5.5*  ALBUMIN 3.2* 3.1* 3.2*    CBG: No results for input(s): GLUCAP in the last 168 hours.   Recent Results (from the past 240 hour(s))  Resp Panel by RT-PCR (Flu Jaidon Sponsel&B, Covid) Nasopharyngeal Swab     Status: None   Collection Time: 04/26/21 10:01 PM   Specimen: Nasopharyngeal Swab; Nasopharyngeal(NP) swabs in vial transport medium  Result Value Ref Range Status   SARS Coronavirus 2 by RT PCR NEGATIVE NEGATIVE Final  Comment: (NOTE) SARS-CoV-2 target nucleic acids are NOT DETECTED.  The SARS-CoV-2 RNA is generally detectable in upper respiratory specimens during the acute phase of infection. The lowest concentration of SARS-CoV-2 viral copies this assay can detect is 138 copies/mL. Ladaisha Portillo negative result does not preclude SARS-Cov-2 infection and should not be used as the sole basis for treatment or other patient management decisions. Cayci Mcnabb negative result may occur with  improper specimen collection/handling, submission of specimen other than nasopharyngeal swab, presence of viral mutation(s) within the areas targeted by this assay, and inadequate number of viral copies(<138 copies/mL). Oliver Neuwirth negative result must be combined with clinical observations, patient history, and epidemiological information. The expected result is Negative.  Fact Sheet for Patients:  BloggerCourse.com  Fact Sheet for Healthcare Providers:  SeriousBroker.it  This test is no t yet approved or cleared by the [place] FDA and  has been authorized for detection and/or diagnosis of SARS-CoV-2 by FDA under an Emergency Use Authorization (EUA). This EUA will remain  in effect (meaning this test can be used) for the duration of the COVID-19 declaration under Section 564(b)(1) of the Act, 21 U.S.C.section 360bbb-3(b)(1), unless the authorization is  terminated  or revoked sooner.       Influenza Azya Barbero by PCR NEGATIVE NEGATIVE Final   Influenza B by PCR NEGATIVE NEGATIVE Final    Comment: (NOTE) The Xpert Xpress SARS-CoV-2/FLU/RSV plus assay is intended as an aid in the diagnosis of influenza from Nasopharyngeal swab specimens and should not be used as Floraine Buechler sole basis for treatment. Nasal washings and aspirates are unacceptable for Xpert Xpress SARS-CoV-2/FLU/RSV testing.  Fact Sheet for Patients: BloggerCourse.com  Fact Sheet for Healthcare Providers: SeriousBroker.it  This test is not yet approved or cleared by the [place] FDA and has been authorized for detection and/or diagnosis of SARS-CoV-2 by FDA under an Emergency Use Authorization (EUA). This EUA will remain in effect (meaning this test can be used) for the duration of the COVID-19 declaration under Section 564(b)(1) of the Act, 21 U.S.C. section 360bbb-3(b)(1), unless the authorization is terminated or revoked.  Performed at [facility], [address] [place], [place] [address]          Radiology Studies: ECHOCARDIOGRAM COMPLETE  Result Date: 04/30/2021    ECHOCARDIOGRAM REPORT   Patient Name:   JANMICHAEL GIRAUD FAOZH Date of Exam: 04/30/2021 Medical Rec #:  [id number]        Height:       64.0 in Accession #:    [id number]       Weight:       115.0 lb Date of Birth:  02/23/2001         BSA:          1.546 m Patient Age:    20 years         BP:           113/54 mmHg Patient Gender: M                HR:           85 bpm. Exam Location:  Inpatient Procedure: 2D Echo, Cardiac Doppler and Color Doppler Indications:    Murmur  History:        Patient has no prior history of Echocardiogram examinations.                 Signs/Symptoms:Murmur and Syncope. 11/12/2015 PFO.  Sonographer:    Neomia Dear RDCS Referring Phys: [id number] PETER R ENNEVER  IMPRESSIONS  1. Left ventricular ejection fraction, by estimation, is  60 to 65%. The left ventricle has normal function. The left ventricle has no regional wall motion abnormalities. Left ventricular diastolic parameters were normal.  2. Right ventricular systolic function is normal. The right ventricular size is normal. There is normal pulmonary artery systolic pressure. The estimated right ventricular systolic pressure is 20.0 mmHg.  3. Left atrial size was mildly dilated.  4. The mitral valve is normal in structure. Trivial mitral valve regurgitation. No evidence of mitral stenosis.  5. The aortic valve is tricuspid. Aortic valve regurgitation is not visualized. Mild to moderate aortic valve sclerosis/calcification is present, without any evidence of aortic stenosis.  6. The inferior vena cava is normal in size with greater than 50% respiratory variability, suggesting right atrial pressure of 3 mmHg. FINDINGS  Left Ventricle: Left ventricular ejection fraction, by estimation, is 60 to 65%. The left ventricle has normal function. The left ventricle has no regional wall motion abnormalities. The left ventricular internal cavity size was normal in size. There is  no left ventricular hypertrophy. Left ventricular diastolic parameters were normal. Right Ventricle: The right ventricular size is normal. No increase in right ventricular wall thickness. Right ventricular systolic function is normal. There is normal pulmonary artery systolic pressure. The tricuspid regurgitant velocity is 2.06 m/s, and  with an assumed right atrial pressure of 3 mmHg, the estimated right ventricular systolic pressure is 20.0 mmHg. Left Atrium: Left atrial size was mildly dilated. Right Atrium: Right atrial size was normal in size. Pericardium: There is no evidence of pericardial effusion. Mitral Valve: The mitral valve is normal in structure. Trivial mitral valve regurgitation. No evidence of mitral valve stenosis. Tricuspid Valve: The tricuspid valve is normal in structure. Tricuspid valve regurgitation is  trivial. No evidence of tricuspid stenosis. Aortic Valve: The aortic valve is tricuspid. Aortic valve regurgitation is not visualized. Mild to moderate aortic valve sclerosis/calcification is present, without any evidence of aortic stenosis. Aortic valve mean gradient measures 9.0 mmHg. Aortic valve peak gradient measures 16.8 mmHg. Aortic valve area, by VTI measures 2.78 cm. Pulmonic Valve: The pulmonic valve was normal in structure. Pulmonic valve regurgitation is trivial. No evidence of pulmonic stenosis. Aorta: The aortic root is normal in size and structure. Venous: The inferior vena cava is normal in size with greater than 50% respiratory variability, suggesting right atrial pressure of 3 mmHg. IAS/Shunts: No atrial level shunt detected by color flow Doppler.  LEFT VENTRICLE PLAX 2D LVIDd:         4.50 cm     Diastology LVIDs:         1.90 cm     LV e' medial:    10.70 cm/s LV PW:         1.00 cm     LV E/e' medial:  8.5 LV IVS:        1.10 cm     LV e' lateral:   10.20 cm/s LVOT diam:     2.10 cm     LV E/e' lateral: 8.9 LV SV:         103 LV SV Index:   67 LVOT Area:     3.46 cm  LV Volumes (MOD) LV vol d, MOD A2C: 90.9 ml LV vol d, MOD A4C: 90.7 ml LV vol s, MOD A2C: 31.6 ml LV vol s, MOD A4C: 19.0 ml LV SV MOD A2C:     59.3 ml LV SV MOD A4C:  90.7 ml LV SV MOD BP:      66.9 ml RIGHT VENTRICLE RV Basal diam:  2.00 cm RV Mid diam:    1.90 cm RV S prime:     12.40 cm/s TAPSE (M-mode): 2.2 cm LEFT ATRIUM             Index       RIGHT ATRIUM           Index LA diam:        3.40 cm 2.20 cm/m  RA Area:     12.60 cm LA Vol (A2C):   59.9 ml 38.74 ml/m RA Volume:   32.60 ml  21.08 ml/m LA Vol (A4C):   49.6 ml 32.08 ml/m LA Biplane Vol: 54.7 ml 35.37 ml/m  AORTIC VALVE                    PULMONIC VALVE AV Area (Vmax):    3.28 cm     PV Vmax:       1.67 m/s AV Area (Vmean):   2.43 cm     PV Vmean:      124.000 cm/s AV Area (VTI):     2.78 cm     PV VTI:        0.371 m AV Vmax:           205.00 cm/s  PV  Peak grad:  11.2 mmHg AV Vmean:          133.000 cm/s PV Mean grad:  7.0 mmHg AV VTI:            0.371 m AV Peak Grad:      16.8 mmHg AV Mean Grad:      9.0 mmHg LVOT Vmax:         194.00 cm/s LVOT Vmean:        93.500 cm/s LVOT VTI:          0.298 m LVOT/AV VTI ratio: 0.80  AORTA Ao Root diam: 3.00 cm Ao Asc diam:  2.10 cm MITRAL VALVE               TRICUSPID VALVE MV Area (PHT): 4.36 cm    TR Peak grad:   17.0 mmHg MV Decel Time: 174 msec    TR Vmax:        206.00 cm/s MV E velocity: 90.70 cm/s MV Pieter Fooks velocity: 79.40 cm/s  SHUNTS MV E/Angell Pincock ratio:  1.14        Systemic VTI:  0.30 m                            Systemic Diam: 2.10 cm Armanda Magic MD Electronically signed by Armanda Magic MD Signature Date/Time: 04/30/2021/2:50:06 PM    Final         Scheduled Meds:  sodium chloride   Intravenous Once   folic acid  2 mg Oral Daily   Continuous Infusions:   LOS: 3 days    Time spent: over 30 min    Lacretia Nicks, MD [facility]   To contact the attending provider between 7A-7P or the covering provider during after hours 7P-7A, please log into the web site [email or web address] and access using universal [place] password for that web site. If you do not have the password, please call the hospital operator.  04/30/2021, 4:44 PM

## 2021-05-01 DIAGNOSIS — D649 Anemia, unspecified: Secondary | ICD-10-CM | POA: Diagnosis not present

## 2021-05-01 LAB — COMPREHENSIVE METABOLIC PANEL
ALT: 22 U/L (ref 0–44)
AST: 21 U/L (ref 15–41)
Albumin: 3.3 g/dL — ABNORMAL LOW (ref 3.5–5.0)
Alkaline Phosphatase: 64 U/L (ref 38–126)
Anion gap: 5 (ref 5–15)
BUN: 16 mg/dL (ref 6–20)
CO2: 22 mmol/L (ref 22–32)
Calcium: 8.8 mg/dL — ABNORMAL LOW (ref 8.9–10.3)
Chloride: 110 mmol/L (ref 98–111)
Creatinine, Ser: 0.7 mg/dL (ref 0.61–1.24)
GFR, Estimated: >60 mL/min (ref >60)
Glucose, Bld: 87 mg/dL (ref 70–99)
Potassium: 4.1 mmol/L (ref 3.5–5.1)
Sodium: 137 mmol/L (ref 135–145)
Total Bilirubin: 0.6 mg/dL (ref 0.3–1.2)
Total Protein: 5.4 g/dL — ABNORMAL LOW (ref 6.5–8.1)

## 2021-05-01 LAB — CBC WITH DIFFERENTIAL/PLATELET
Abs Immature Granulocytes: 0.07 10*3/uL (ref 0.00–0.07)
Basophils Absolute: 0 10*3/uL (ref 0.0–0.1)
Basophils Relative: 0 %
Eosinophils Absolute: 0.3 10*3/uL (ref 0.0–0.5)
Eosinophils Relative: 6 %
HCT: 27.1 % — ABNORMAL LOW (ref 39.0–52.0)
Hemoglobin: 8.8 g/dL — ABNORMAL LOW (ref 13.0–17.0)
Immature Granulocytes: 1 %
Lymphocytes Relative: 16 %
Lymphs Abs: 0.8 10*3/uL (ref 0.7–4.0)
MCH: 27.9 pg (ref 26.0–34.0)
MCHC: 32.5 g/dL (ref 30.0–36.0)
MCV: 86 fL (ref 80.0–100.0)
Monocytes Absolute: 0.6 10*3/uL (ref 0.1–1.0)
Monocytes Relative: 13 %
Neutro Abs: 3.2 10*3/uL (ref 1.7–7.7)
Neutrophils Relative %: 64 %
Platelets: 175 10*3/uL (ref 150–400)
RBC: 3.15 MIL/uL — ABNORMAL LOW (ref 4.22–5.81)
RDW: 18.9 % — ABNORMAL HIGH (ref 11.5–15.5)
WBC: 5 10*3/uL (ref 4.0–10.5)
nRBC: 0 % (ref 0.0–0.2)

## 2021-05-01 LAB — RETICULOCYTES
Immature Retic Fract: 33.9 % — ABNORMAL HIGH (ref 2.3–15.9)
RBC.: 3.08 MIL/uL — ABNORMAL LOW (ref 4.22–5.81)
Retic Count, Absolute: 218.7 10*3/uL — ABNORMAL HIGH (ref 19.0–186.0)
Retic Ct Pct: 7.1 % — ABNORMAL HIGH (ref 0.4–3.1)

## 2021-05-01 LAB — MAGNESIUM: Magnesium: 2 mg/dL (ref 1.7–2.4)

## 2021-05-01 MED ORDER — FOLIC ACID 1 MG PO TABS: 2.0000 mg | ORAL_TABLET | Freq: Every day | ORAL | 0 refills | Status: DC

## 2021-05-01 NOTE — Progress Notes (Signed)
Pt discharged home with mother in stable condition. Discharge instructions given. Script sent to pharmacy of choice. No immediate questions or concerns. Discharged from unit via wheelchair.

## 2021-05-01 NOTE — Discharge Summary (Signed)
Physician Discharge Summary  Donne Baley Devaux TMA:[id number] DOB: 09-Apr-2001 DOA: 04/26/2021  PCP: Alroy Dust, L.Marlou Sa, MD  Admit date: 04/26/2021 Discharge date: 05/01/2021  Time spent: 40 minutes  Recommendations for Outpatient Follow-up:  Follow outpatient CBC/CMP Follow with heme outpatient for further w/u of anemia   Follow pending parvovirus studies Follow pending cytogenetics, FISH in surgical pathology   Discharge Diagnoses:  Principal Problem:   Symptomatic anemia Active Problems:   Growth hormone deficiency ([place])   Mild protein-calorie malnutrition ([place])   Near syncope   Discharge Condition: stable  Diet recommendation: heart healthy  Filed Weights   04/26/21 1957  Weight: 52.2 kg    History of present illness:  20 y.o. male, with history of asthma, constitutional growth delay, growth hormone deficiency, short stature for age, presented to the ED for chief complaint of near syncope.  Patient apparently has been experiencing generalized weakness and feeling lightheaded for Taj Nevins few days.  He started working at [employer], [employer], recently.  Apparently they went to urgent care center after he had Javeon Macmurray near syncopal episode and he was given IV fluids.  She found him paler than usual.  On the day of presentation he was weak and fatigued and once again had Burnice Oestreicher near syncopal episode.  Did not really pass out completely.  Has not noticed any kind of bleeding from any source site recently.  Denies any black stools.  Denies any fever or chills recently.  No acute illness recently.  No previous history of anemia.  No history of sickle cell disease or trait in the family.  Noted to have Gavino Fouch hemoglobin of 4.9.  Blood transfusion was initiated and he was hospitalized.  He was found to have severe anemia.  Heme was consulted and workup completed as noted below.  He's now s/p transfusion and IV iron.  Hb now improving.  Follow outpatient.  See below for additional details    Hospital Course:  Severe symptomatic microcytic anemia with near syncope/iron deficiency Hb 4.9 at presentation S/p 3 units pRBC 8.8 today, retics increasing Appreciate heme c/s - plan for outpatient follow up, unclear reason for marrow suppression - nutritional deficiency/developmental issues - needs follow up outpatient - folic acid, iron  Labs c/w iron def anemia S/p feraheme x2 Cold agglutinin negative Parvovirus pending S/p right iliac aspiration and core marrow biopsy - hypocellular marrow with erythroid hyperplacia, normocytic anemia, mild thrombocytopenia - non specific findings, combination of factors (iron def, insult/infection, cessation of growth hormones) (see biopsy report - follow FISH for MDS) - follow outpatient with heme Hgb fractionation cascade/electrophoresis with normal hemoglobin, no hemoglobin variant or beta thalassemia (alpha thal may not be detected by Hbg fractionation cascate panel) Heme c/s, appreciate assistance  Thrombocytopenia resolved  History of hypopituitarism/growth hormone deficiency He is followed by pediatric endocrinology.  Last seen by them in May.  His growth hormone injections were discontinued at that time since his growth had ceased which was consistent with fusion of bones on x-ray of hand.  Plan was to repeat IGF 1 and BP 3 in 3 months.   Systolic murmur Echo with mild to moderate AV sclerosis/calcification    Procedures: 6/14 IMPRESSION: Successful right iliac aspiration and core marrow biopsy.  Echo IMPRESSIONS     1. Left ventricular ejection fraction, by estimation, is 60 to 65%. The  left ventricle has normal function. The left ventricle has no regional  wall motion abnormalities. Left ventricular diastolic parameters were  normal.   2.  Right ventricular systolic function is normal. The right ventricular  size is normal. There is normal pulmonary artery systolic pressure. The  estimated right ventricular systolic  pressure is 65.9 mmHg.   3. Left atrial size was mildly dilated.   4. The mitral valve is normal in structure. Trivial mitral valve  regurgitation. No evidence of mitral stenosis.   5. The aortic valve is tricuspid. Aortic valve regurgitation is not  visualized. Mild to moderate aortic valve sclerosis/calcification is  present, without any evidence of aortic stenosis.   6. The inferior vena cava is normal in size with greater than 50%  respiratory variability, suggesting right atrial pressure of 3 mmHg.    Consultations: Oncology IR  Discharge Exam: Vitals:   04/30/21 2024 05/01/21 0546  BP: (!) 120/49 (!) 106/49  Pulse: 75 61  Resp: 14 14  Temp: 99.3 F (37.4 C) 99.1 F (37.3 C)  SpO2: 100% 100%   No complaints Discussed d/c at bedside with mother and pt  General: No acute distress. Cardiovascular: Heart sounds show Kaitrin Seybold regular rate, and rhythm. Systolic murmur 3/6. Lungs: Clear to auscultation bilaterally Abdomen: Soft, nontender, nondistended  Neurological: Alert and oriented 3. Moves all extremities 4 . Cranial nerves II through XII grossly intact. Skin: Warm and dry. No rashes or lesions. Extremities: No clubbing or cyanosis. No edema.  Discharge Instructions   Discharge Instructions     Call MD for:  difficulty breathing, headache or visual disturbances   Complete by: As directed    Call MD for:  extreme fatigue   Complete by: As directed    Call MD for:  hives   Complete by: As directed    Call MD for:  persistant dizziness or light-headedness   Complete by: As directed    Call MD for:  persistant nausea and vomiting   Complete by: As directed    Call MD for:  redness, tenderness, or signs of infection (pain, swelling, redness, odor or green/yellow discharge around incision site)   Complete by: As directed    Call MD for:  severe uncontrolled pain   Complete by: As directed    Call MD for:  temperature >100.4   Complete by: As directed    Diet - low  sodium heart healthy   Complete by: As directed    Discharge instructions   Complete by: As directed    You were seen for anemia (low red blood cells).  This has improved after transfusion and IV iron.  Please follow up with hematology outpatient for further management and monitoring and workup of your anemia.    Please follow up with Dr. Marin Olp as an outpatient.  Continue your iron.  We'll also start you on folic acid to continue at home.    Return for new, recurrent, or worsening symptoms.  Please ask your PCP to request records from this hospitalization so they know what was done and what the next steps will be.   Increase activity slowly   Complete by: As directed       Allergies as of 05/01/2021   No Known Allergies      Medication List     TAKE these medications    acetaminophen 500 MG tablet Commonly known as: TYLENOL Take 500 mg by mouth every 6 (six) hours as needed.   albuterol 108 (90 Base) MCG/ACT inhaler Commonly known as: VENTOLIN HFA Inhale into the lungs every 6 (six) hours as needed for wheezing or shortness of breath. Reported on  03/31/2016   albuterol 0.63 MG/3ML nebulizer solution Commonly known as: ACCUNEB Take 1 ampule by nebulization as needed. Reported on 03/31/2016   ferrous sulfate 325 (65 FE) MG tablet Take 325 mg by mouth once.   folic acid 1 MG tablet Commonly known as: FOLVITE Take 2 tablets (2 mg total) by mouth daily.   ondansetron 4 MG disintegrating tablet Commonly known as: Zofran ODT Take 1 tablet (4 mg total) by mouth every 8 (eight) hours as needed for nausea or vomiting.       ASK your doctor about these medications    cyproheptadine 4 MG tablet Commonly known as: PERIACTIN Take 1 tablet (4 mg total) by mouth at bedtime.   Nutropin AQ NuSpin 10 5 MG/ML Soln Generic drug: Somatropin INJECT 1.7MG SUBCUTANEOUSLY 6 DAYS PER WEEK (DISCARD 28 DAYS AFTER FIRST USE)   Nutropin AQ NuSpin 10 10 MG/2ML Sopn Generic drug:  Somatropin INJECT 2MG SUBCUTANEOUSLY  DAILY (DISCARD 28 DAYS  AFTER FIRST USE)       No Known Allergies  Follow-up Information     Mitchell, L.Marlou Sa, MD Follow up.   Specialty: Family Medicine Contact information: [address] [place] [address] [phone]         Volanda Napoleon, MD Follow up.   Specialty: Oncology Contact information: [place] [address] [phone]                  The results of significant diagnostics from this hospitalization (including imaging, microbiology, ancillary and laboratory) are listed below for reference.    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 ANESTHESIA/SEDATION: Moderate (conscious) sedation was employed during this procedure. Lowanda Cashaw total of 2 milligrams versed and 100 micrograms fentanyl were administered intravenously. The patient's level of consciousness and vital signs were monitored continuously by radiology nursing throughout the procedure under my direct supervision. Total monitored sedation time: 10 minutes FLUOROSCOPY TIME:  None COMPLICATIONS: None immediate. Estimated blood loss: <25 mL PROCEDURE: Informed written consent was obtained from the patient after Chrisean Kloth thorough discussion of the procedural risks, benefits and alternatives. All questions were addressed. Maximal Sterile Barrier Technique was utilized including caps, mask, sterile gowns, sterile gloves, sterile drape, hand hygiene and skin antiseptic. Lenford Beddow timeout was performed prior to the initiation of the procedure. The patient was positioned prone and non-contrast localization CT was performed of the pelvis to demonstrate the iliac marrow spaces. Maximal barrier sterile  technique utilized including caps, mask, sterile gowns, sterile gloves, large sterile drape, hand hygiene, and betadine prep. Under sterile conditions and local anesthesia, an 11 gauge coaxial bone biopsy needle was advanced into the right iliac marrow space. Needle position was confirmed with CT imaging. Initially, bone marrow aspiration was performed. Next, the 11 gauge outer cannula was utilized to obtain Troi Florendo right iliac bone marrow core biopsy. Needle was removed. Hemostasis was obtained with compression. The patient tolerated the procedure well. Samples were prepared with the cytotechnologist. IMPRESSION: Successful right iliac aspiration and core marrow biopsy. Electronically Signed   By: Jacqulynn Cadet M.D.   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 ANESTHESIA/SEDATION: Moderate (conscious) sedation was employed during this procedure. Yeudiel Mateo total of 2 milligrams versed and 100 micrograms fentanyl were administered intravenously. The patient's level of consciousness and vital signs were monitored continuously by radiology nursing throughout the procedure under my direct supervision. Total monitored sedation time: 10 minutes FLUOROSCOPY TIME:  None COMPLICATIONS: None immediate. Estimated blood loss: <25 mL PROCEDURE: Informed written consent was obtained from the patient after Chea Malan thorough discussion of the procedural risks, benefits and alternatives. All questions were addressed. Maximal Sterile Barrier Technique was utilized including caps, mask, sterile gowns, sterile gloves, sterile drape, hand hygiene and skin antiseptic. Gussie Murton timeout was performed prior to the initiation of the procedure. The patient was positioned prone and non-contrast localization CT  was performed of the pelvis to demonstrate the iliac marrow spaces. Maximal barrier sterile technique utilized including caps, mask, sterile gowns, sterile gloves, large sterile drape, hand hygiene, and betadine prep. Under sterile conditions and local anesthesia, an 11 gauge coaxial bone biopsy needle was advanced into the right iliac marrow space. Needle position was confirmed with CT imaging. Initially, bone marrow aspiration was performed. Next, the 11 gauge outer cannula was utilized to obtain Jozeph Persing right iliac bone marrow core biopsy. Needle was removed. Hemostasis was obtained with compression. The patient tolerated the procedure well. Samples were prepared with the cytotechnologist. IMPRESSION: Successful right iliac aspiration and core marrow biopsy. Electronically Signed   By: Jacqulynn Cadet M.D.   On: 04/28/2021 13:07   ECHOCARDIOGRAM COMPLETE  Result Date: 04/30/2021    ECHOCARDIOGRAM REPORT   Patient Name:   NARCISO STOUTENBURG MVHQI Date of Exam: 04/30/2021 Medical Rec #:  [id number]        Height:       64.0 in Accession #:    [id number]       Weight:       115.0 lb Date of Birth:  10-25-2001         BSA:          1.546 m Patient Age:    20 years         BP:           113/54 mmHg Patient Gender: M                HR:           85 bpm. Exam Location:  Inpatient Procedure: 2D Echo, Cardiac Doppler and Color Doppler Indications:    Murmur  History:        Patient has no prior history of Echocardiogram examinations.                 Signs/Symptoms:Murmur and Syncope. 11/12/2015 PFO.  Sonographer:    Luisa Hart RDCS Referring Phys: [place]  1. Left ventricular ejection fraction, by estimation, is 60 to 65%. The left ventricle has normal function. The left ventricle has no regional wall motion abnormalities. Left ventricular diastolic parameters were normal.  2. Right ventricular systolic function is normal. The right ventricular size is normal. There is normal pulmonary artery systolic  pressure. The estimated right ventricular systolic pressure is 25.3 mmHg.  3. Left atrial size was mildly dilated.  4. The mitral valve is normal in structure. Trivial mitral valve regurgitation. No evidence of mitral stenosis.  5. The aortic valve is tricuspid. Aortic valve regurgitation is not visualized. Mild to moderate aortic valve sclerosis/calcification is present, without any evidence of aortic stenosis.  6. The inferior vena cava is normal in size with greater than 50% respiratory variability, suggesting right atrial pressure of 3 mmHg. FINDINGS  Left Ventricle: Left ventricular ejection fraction, by estimation, is 60 to 65%. The left ventricle has normal function. The left ventricle has no regional wall motion abnormalities. The left ventricular internal cavity size was normal in size. There is  no left ventricular hypertrophy. Left ventricular diastolic parameters were normal. Right Ventricle: The right ventricular size is normal. No increase in right ventricular wall thickness. Right ventricular systolic function is normal. There is normal pulmonary artery systolic pressure. The tricuspid regurgitant velocity is 2.06 m/s, and  with an assumed right atrial pressure of 3 mmHg, the estimated right ventricular systolic pressure is 81.4 mmHg. Left Atrium: Left atrial size was mildly dilated. Right Atrium: Right atrial size was normal in size. Pericardium: There is no evidence of pericardial effusion. Mitral Valve: The mitral valve is normal in structure. Trivial mitral valve regurgitation. No evidence of mitral valve stenosis. Tricuspid Valve: The tricuspid valve is normal in structure. Tricuspid valve regurgitation is trivial. No evidence of tricuspid stenosis. Aortic Valve: The aortic valve is tricuspid. Aortic valve regurgitation is not visualized. Mild to moderate aortic valve sclerosis/calcification is present, without any evidence of aortic stenosis. Aortic valve mean gradient measures 9.0 mmHg. Aortic  valve peak gradient measures 16.8 mmHg. Aortic valve area, by VTI measures 2.78 cm. Pulmonic Valve: The pulmonic valve was normal in structure. Pulmonic valve regurgitation is trivial. No evidence of pulmonic stenosis. Aorta: The aortic root is normal in size and structure. Venous: The inferior vena cava is normal in size with greater than 50% respiratory variability, suggesting right atrial pressure of 3 mmHg. IAS/Shunts: No atrial level shunt detected by color flow Doppler.  LEFT VENTRICLE PLAX 2D LVIDd:         4.50 cm     Diastology LVIDs:         1.90 cm     LV e' medial:    10.70 cm/s LV PW:         1.00 cm     LV E/e' medial:  8.5 LV IVS:        1.10 cm     LV e' lateral:   10.20 cm/s LVOT diam:     2.10 cm     LV E/e' lateral: 8.9 LV SV:         103 LV SV Index:   67 LVOT Area:     3.46 cm  LV Volumes (MOD) LV vol d, MOD A2C: 90.9 ml LV vol d, MOD A4C: 90.7 ml LV vol s, MOD A2C: 31.6 ml LV vol s, MOD A4C: 19.0 ml LV SV MOD A2C:     59.3 ml LV SV MOD A4C:     90.7 ml LV SV MOD BP:      66.9 ml RIGHT VENTRICLE RV Basal diam:  2.00 cm RV Mid diam:    1.90 cm RV S prime:     12.40 cm/s TAPSE (M-mode): 2.2 cm LEFT ATRIUM             Index       RIGHT ATRIUM           Index LA diam:        3.40 cm 2.20 cm/m  RA Area:     12.60 cm LA Vol (A2C):   59.9 ml 38.74 ml/m RA Volume:   32.60 ml  21.08 ml/m LA Vol (A4C):  49.6 ml 32.08 ml/m LA Biplane Vol: 54.7 ml 35.37 ml/m  AORTIC VALVE                    PULMONIC VALVE AV Area (Vmax):    3.28 cm     PV Vmax:       1.67 m/s AV Area (Vmean):   2.43 cm     PV Vmean:      124.000 cm/s AV Area (VTI):     2.78 cm     PV VTI:        0.371 m AV Vmax:           205.00 cm/s  PV Peak grad:  11.2 mmHg AV Vmean:          133.000 cm/s PV Mean grad:  7.0 mmHg AV VTI:            0.371 m AV Peak Grad:      16.8 mmHg AV Mean Grad:      9.0 mmHg LVOT Vmax:         194.00 cm/s LVOT Vmean:        93.500 cm/s LVOT VTI:          0.298 m LVOT/AV VTI ratio: 0.80  AORTA Ao Root diam:  3.00 cm Ao Asc diam:  2.10 cm MITRAL VALVE               TRICUSPID VALVE MV Area (PHT): 4.36 cm    TR Peak grad:   17.0 mmHg MV Decel Time: 174 msec    TR Vmax:        206.00 cm/s MV E velocity: 90.70 cm/s MV Shepherd Finnan velocity: 79.40 cm/s  SHUNTS MV E/Yoshimi Sarr ratio:  1.14        Systemic VTI:  0.30 m                            Systemic Diam: 2.10 cm Fransico Him MD Electronically signed by Fransico Him MD Signature Date/Time: 04/30/2021/2:50:06 PM    Final    CT CHEST ABDOMEN PELVIS WO CONTRAST  Result Date: 04/27/2021 CLINICAL DATA:  Recurrent near syncopal episodes with anemia of unknown etiology. History of asthma and constitutional growth 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 EXAM: CT CHEST, ABDOMEN AND PELVIS WITHOUT CONTRAST TECHNIQUE: Multidetector CT imaging of the chest, abdomen and pelvis was performed following the standard protocol without IV contrast. COMPARISON:  Scrotal ultrasound 10/07/2015 FINDINGS: CT CHEST FINDINGS Cardiovascular: No significant vascular findings on noncontrast imaging. Decreased density of the blood pool consistent with anemia. The heart size is normal. There is no pericardial effusion. Mediastinum/Nodes: There are no enlarged mediastinal, hilar or axillary lymph nodes. Hilar assessment is limited by the lack of intravenous contrast. Residual thymic tissue in the anterior mediastinum, typical for age. The thyroid gland, trachea and esophagus demonstrate no significant findings. Lungs/Pleura: There is no pleural effusion. 4 mm ground-glass nodule in the left lower lobe on image 106/6, likely Rexanne Inocencio lymph node or focus of inflammation. Per consensus guidelines, this requires no specific follow-up. The lungs are otherwise clear. Musculoskeletal/Chest wall: No chest wall mass or suspicious osseous findings. CT ABDOMEN AND PELVIS FINDINGS Hepatobiliary: The liver appears unremarkable as imaged in the noncontrast state. No evidence of gallstones, gallbladder wall  thickening or biliary dilatation. Pancreas: Unremarkable. No pancreatic ductal dilatation or surrounding inflammatory changes. Spleen: Normal in size without focal abnormality. Adrenals/Urinary Tract: Both adrenal glands appear  normal. Both kidneys appear unremarkable on noncontrast imaging. No evidence of urinary tract calculus or hydronephrosis. The bladder appears normal. Stomach/Bowel: Enteric contrast was administered and has passed into the mid to distal small bowel. The stomach appears unremarkable for its degree of distension. No evidence of bowel wall thickening, distention or surrounding inflammatory change. The appendix appears normal. Moderate stool throughout the colon. Vascular/Lymphatic: There are no enlarged abdominal or pelvic lymph nodes. No significant vascular findings on noncontrast imaging. Reproductive: The prostate gland and seminal vesicles appear unremarkable. Other: No ascites, free air or focal extraluminal fluid collection. Musculoskeletal: No acute or significant osseous findings. IMPRESSION: 1. Low-density blood pool consistent with known anemia, etiology undetermined. No splenomegaly. 2. No evidence of neoplasm or acute findings in the chest, abdomen or pelvis. Electronically Signed   By: Richardean Sale M.D.   On: 04/27/2021 16:25    Microbiology: Recent Results (from the past 240 hour(s))  Resp Panel by RT-PCR (Flu Grayling Schranz&B, Covid) Nasopharyngeal Swab     Status: None   Collection Time: 04/26/21 10:01 PM   Specimen: Nasopharyngeal Swab; Nasopharyngeal(NP) swabs in vial transport medium  Result Value Ref Range Status   SARS Coronavirus 2 by RT PCR NEGATIVE NEGATIVE Final    Comment: (NOTE) SARS-CoV-2 target nucleic acids are NOT DETECTED.  The SARS-CoV-2 RNA is generally detectable in upper respiratory specimens during the acute phase of infection. The lowest concentration of SARS-CoV-2 viral copies this assay can detect is 138 copies/mL. Sariya Trickey negative result does not preclude  SARS-Cov-2 infection and should not be used as the sole basis for treatment or other patient management decisions. Shariece Viveiros negative result may occur with  improper specimen collection/handling, submission of specimen other than nasopharyngeal swab, presence of viral mutation(s) within the areas targeted by this assay, and inadequate number of viral copies(<138 copies/mL). Preethi Scantlebury negative result must be combined with clinical observations, patient history, and epidemiological information. The expected result is Negative.  Fact Sheet for Patients:  EntrepreneurPulse.com.au  Fact Sheet for Healthcare Providers:  IncredibleEmployment.be  This test is no t yet approved or cleared by the [place] FDA and  has been authorized for detection and/or diagnosis of SARS-CoV-2 by FDA under an Emergency Use Authorization (EUA). This EUA will remain  in effect (meaning this test can be used) for the duration of the COVID-19 declaration under Section 564(b)(1) of the Act, 21 U.S.C.section 360bbb-3(b)(1), unless the authorization is terminated  or revoked sooner.       Influenza Bowe Sidor by PCR NEGATIVE NEGATIVE Final   Influenza B by PCR NEGATIVE NEGATIVE Final    Comment: (NOTE) The Xpert Xpress SARS-CoV-2/FLU/RSV plus assay is intended as an aid in the diagnosis of influenza from Nasopharyngeal swab specimens and should not be used as Drayden Lukas sole basis for treatment. Nasal washings and aspirates are unacceptable for Xpert Xpress SARS-CoV-2/FLU/RSV testing.  Fact Sheet for Patients: EntrepreneurPulse.com.au  Fact Sheet for Healthcare Providers: IncredibleEmployment.be  This test is not yet approved or cleared by the [place] FDA and has been authorized for detection and/or diagnosis of SARS-CoV-2 by FDA under an Emergency Use Authorization (EUA). This EUA will remain in effect (meaning this test can be used) for the duration of  the COVID-19 declaration under Section 564(b)(1) of the Act, 21 U.S.C. section 360bbb-3(b)(1), unless the authorization is terminated or revoked.  Performed at [facility], [place] [address], [place], [place] [address]      Labs: Basic Metabolic Panel: Recent Labs  Lab 04/26/21 2005 04/27/21  0740 04/28/21 0547 04/29/21 2152 04/30/21 0535 05/01/21 0533  NA 136 134* 137  --  138 137  K 4.1 3.6 3.8  --  4.1 4.1  CL 106 104 106  --  108 110  CO2 '26 23 25  ' --  24 22  GLUCOSE 134* 88 89  --  92 87  BUN 26* 19 15  --  18 16  CREATININE 0.75 0.72 0.79  --  0.81 0.70  CALCIUM 8.4* 8.4* 8.9  --  8.8* 8.8*  MG  --  1.7  --   --  2.0 2.0  PHOS  --   --   --  4.7* 4.7*  --    Liver Function Tests: Recent Labs  Lab 04/26/21 2005 04/27/21 0740 04/30/21 0535 05/01/21 0533  AST '18 15 25 21  ' ALT '12 12 24 22  ' ALKPHOS 56 46 52 64  BILITOT 0.2* 0.4 0.2* 0.6  PROT 5.5* 5.0* 5.5* 5.4*  ALBUMIN 3.2* 3.1* 3.2* 3.3*   Recent Labs  Lab 04/26/21 2148  LIPASE 29   No results for input(s): AMMONIA in the last 168 hours. CBC: Recent Labs  Lab 04/26/21 2005 04/27/21 0740 04/27/21 1258 04/28/21 0547 04/29/21 1019 04/30/21 0535 05/01/21 0533  WBC 9.9   < > 8.3 6.5 6.0 6.1 5.0  NEUTROABS 6.2  --   --   --  3.4 4.1 3.2  HGB 4.9*   < > 7.2* 6.9* 8.3* 8.4* 8.8*  HCT 15.8*   < > 21.3* 20.1* 25.6* 25.5* 27.1*  MCV 76.7*   < > 81.3 81.7 84.5 85.0 86.0  PLT 157   < > 109* 115* 140* 159 175   < > = values in this interval not displayed.   Cardiac Enzymes: No results for input(s): CKTOTAL, CKMB, CKMBINDEX, TROPONINI in the last 168 hours. BNP: BNP (last 3 results) No results for input(s): BNP in the last 8760 hours.  ProBNP (last 3 results) No results for input(s): PROBNP in the last 8760 hours.  CBG: No results for input(s): GLUCAP in the last 168 hours.     Signed:  Fayrene Helper MD.  [facility] 05/01/2021, 9:41 AM

## 2021-05-01 NOTE — TOC Transition Note (Signed)
Transition of Care [facility]) - CM/SW Discharge Note   Patient Details  Name: Sean [name] MRN: [id number] Date of Birth: Jun 11, 2001  Transition of Care [facility]) CM/SW Contact:  Golda Acre, RN Phone Number: 05/01/2021, 10:07 AM   Clinical Narrative:    Patient dcd to home.  Appointment for f/u care made at the [facility] care at [facility] on July 15th at 1330.  Will print out with dc instructions.   Final next level of care: Home/Self Care Barriers to Discharge: No Barriers Identified   Patient Goals and CMS Choice        Discharge Placement                       Discharge Plan and Services   Discharge Planning Services: CM Consult                                 Social Determinants of Health (SDOH) Interventions     Readmission Risk Interventions No flowsheet data found.

## 2021-05-01 NOTE — Progress Notes (Signed)
Thankfully, the bone marrow report came back yesterday.  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  As such, looks like his red cells are starting to come back.  I do not know if this was from iron deficiency.  There is no evidence of myelodysplasia.  There is no malignancy.  There is no leukemia or lymphoma infiltrate.  I think that he will be able to go home today.  His hemoglobin is trending upward.  It is 8.8 today.  I think what is telling is that his platelet count is going up.  For some reason, I think that he had some type of marrow suppression.  Whether or not this was from some nutritional deficiency or whether this has to do with his developmental issues, I do not know but everything is improving.  I do think that he will need folic acid when he goes home.  I think this will be very important for him.  I told him to make sure that he eats well.  He really needs to take in protein.  He needs to have his vegetables and vitamins from the vegetables.  Of note, his reticulocyte count is still going up.  Again I think this is reflective of his bone marrow trying to increase his erythroid production.  I know that he is gotten fantastic care from all the staff up on [address].  [name], MD  Franky Macho 1:37

## 2021-05-04 LAB — HUMAN PARVOVIRUS DNA DETECTION BY PCR: Parvovirus B19, PCR: NEGATIVE

## 2021-05-05 ENCOUNTER — Encounter (HOSPITAL_COMMUNITY): Payer: Self-pay | Admitting: Hematology & Oncology

## 2021-05-06 ENCOUNTER — Encounter (HOSPITAL_COMMUNITY): Payer: Self-pay | Admitting: Hematology & Oncology

## 2021-05-07 LAB — SURGICAL PATHOLOGY

## 2021-05-26 ENCOUNTER — Inpatient Hospital Stay (HOSPITAL_BASED_OUTPATIENT_CLINIC_OR_DEPARTMENT_OTHER): Payer: 59 | Admitting: Hematology & Oncology

## 2021-05-26 ENCOUNTER — Encounter: Payer: Self-pay | Admitting: Hematology & Oncology

## 2021-05-26 ENCOUNTER — Inpatient Hospital Stay: Payer: 59 | Attending: Hematology & Oncology

## 2021-05-26 ENCOUNTER — Other Ambulatory Visit: Payer: Self-pay

## 2021-05-26 VITALS — BP 101/59 | HR 66 | Temp 98.2°F | Resp 16 | Wt 114.0 lb

## 2021-05-26 DIAGNOSIS — Z79899 Other long term (current) drug therapy: Secondary | ICD-10-CM | POA: Diagnosis not present

## 2021-05-26 DIAGNOSIS — D649 Anemia, unspecified: Secondary | ICD-10-CM

## 2021-05-26 DIAGNOSIS — E230 Hypopituitarism: Secondary | ICD-10-CM | POA: Diagnosis not present

## 2021-05-26 LAB — CBC WITH DIFFERENTIAL (CANCER CENTER ONLY)
Abs Immature Granulocytes: 0.02 10*3/uL (ref 0.00–0.07)
Basophils Absolute: 0.1 10*3/uL (ref 0.0–0.1)
Basophils Relative: 1 %
Eosinophils Absolute: 0.5 10*3/uL (ref 0.0–0.5)
Eosinophils Relative: 9 %
HCT: 39.9 % (ref 39.0–52.0)
Hemoglobin: 12.8 g/dL — ABNORMAL LOW (ref 13.0–17.0)
Immature Granulocytes: 0 %
Lymphocytes Relative: 30 %
Lymphs Abs: 1.8 10*3/uL (ref 0.7–4.0)
MCH: 27 pg (ref 26.0–34.0)
MCHC: 32.1 g/dL (ref 30.0–36.0)
MCV: 84.2 fL (ref 80.0–100.0)
Monocytes Absolute: 0.6 10*3/uL (ref 0.1–1.0)
Monocytes Relative: 9 %
Neutro Abs: 3 10*3/uL (ref 1.7–7.7)
Neutrophils Relative %: 51 %
Platelet Count: 216 10*3/uL (ref 150–400)
RBC: 4.74 MIL/uL (ref 4.22–5.81)
RDW: 13.3 % (ref 11.5–15.5)
WBC Count: 5.9 10*3/uL (ref 4.0–10.5)
nRBC: 0 % (ref 0.0–0.2)

## 2021-05-26 LAB — RETICULOCYTES
Immature Retic Fract: 8.8 % (ref 2.3–15.9)
RBC.: 4.76 MIL/uL (ref 4.22–5.81)
Retic Count, Absolute: 56.2 10*3/uL (ref 19.0–186.0)
Retic Ct Pct: 1.2 % (ref 0.4–3.1)

## 2021-05-26 LAB — SAVE SMEAR(SSMR), FOR PROVIDER SLIDE REVIEW

## 2021-05-26 LAB — LACTATE DEHYDROGENASE: LDH: 135 U/L (ref 98–192)

## 2021-05-26 NOTE — Progress Notes (Signed)
Hematology and Oncology Follow Up Visit  Sean [name] [id number] 18-Mar-2001 20 y.o. 05/26/2021   Principle Diagnosis:  Multifactorial anemia-likely malnutrition/iron deficiency  Current Therapy:   Observation     Interim History:  Sean [name] is back for his first office visit.  He is doing much better.  We saw him in the hospital back in June.  At the time, he was quite anemic.  He did have some iron deficiency.  He has not been eating well.  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  He had a type of growth hormone deficiency.  When he came in, his hemoglobin was 4.9.  MCV was 76.  Again he was found to be iron deficient.  Ultimately, we did a bone marrow biopsy on him.  This was done on 04/28/2021.  The pathology report ([id number]) showed a hyposeven marrow with erythroid hyperplasia.  All of his cytogenetics were normal.  FISH panel was normal.  I really thought that he was somewhat malnourished and then once he got nourishment into him and got his iron levels back, he would improve.  He improved quite nicely.  When he left the hospital, his hemoglobin was 8.8.  He is doing okay.  Still not not eating well.  He just does not like to eat regular food.  He does eat quite a bit of fast food.  He is going go to college.  He will go to [employer].  He was working at [employer].  Hopefully he will be able to go back to work.  He has had no change in bowel or bladder habits.  He has had no fever.  He has had no nausea or vomiting.  There has been no rashes.  He has had no cough.  Overall, his performance status is ECOG 1.  Medications:  Current Outpatient Medications:    acetaminophen (TYLENOL) 500 MG tablet, Take 500 mg by mouth every 6 (six) hours as needed., Disp: , Rfl:    albuterol (ACCUNEB) 0.63 MG/3ML nebulizer solution, Take 1 ampule by nebulization as needed. Reported on 03/31/2016, Disp: , Rfl:    albuterol (PROVENTIL HFA;VENTOLIN HFA) 108 (90 Base) MCG/ACT  inhaler, Inhale into the lungs every 6 (six) hours as needed for wheezing or shortness of breath. Reported on 03/31/2016, Disp: , Rfl:    cyproheptadine (PERIACTIN) 4 MG tablet, Take 1 tablet (4 mg total) by mouth at bedtime. (Patient not taking: No sig reported), Disp: 30 tablet, Rfl: 5   ferrous sulfate 325 (65 FE) MG tablet, Take 325 mg by mouth once., Disp: , Rfl:    folic acid (FOLVITE) 1 MG tablet, Take 2 tablets (2 mg total) by mouth daily., Disp: 60 tablet, Rfl: 0   NUTROPIN AQ NUSPIN 10 10 MG/2ML SOPN, INJECT 2MG SUBCUTANEOUSLY  DAILY (DISCARD 28 DAYS  AFTER FIRST USE) (Patient not taking: Reported on 04/26/2021), Disp: 12 mL, Rfl: 1  Allergies: No Known Allergies  Past Medical History, Surgical history, Social history, and Family History were reviewed and updated.  Review of Systems: Review of Systems  Constitutional:  Positive for appetite change.  HENT:  Negative.    Eyes: Negative.   Respiratory: Negative.    Cardiovascular: Negative.   Gastrointestinal: Negative.   Endocrine: Negative.   Genitourinary: Negative.    Musculoskeletal: Negative.   Skin: Negative.   Neurological: Negative.   Hematological: Negative.   Psychiatric/Behavioral: Negative.     Physical Exam:  weight is 114 lb (51.7 kg).  His oral temperature is 98.2 F (36.8 C). His blood pressure is 101/59 (abnormal) and his pulse is 66. His respiration is 16 and oxygen saturation is 100%.   Wt Readings from Last 3 Encounters:  05/26/21 114 lb (51.7 kg) (2 %, Z= -2.17)*  04/26/21 115 lb (52.2 kg) (2 %, Z= -2.08)*  04/03/21 115 lb 3.2 oz (52.3 kg) (2 %, Z= -2.06)*   * Growth percentiles are based on CDC (Boys, 2-20 Years) data.    Physical Exam Vitals reviewed.  HENT:     Head: Normocephalic and atraumatic.  Eyes:     Pupils: Pupils are equal, round, and reactive to light.  Cardiovascular:     Rate and Rhythm: Normal rate and regular rhythm.     Heart sounds: Normal heart sounds.  Pulmonary:      Effort: Pulmonary effort is normal.     Breath sounds: Normal breath sounds.  Abdominal:     General: Bowel sounds are normal.     Palpations: Abdomen is soft.  Musculoskeletal:        General: No tenderness or deformity. Normal range of motion.     Cervical back: Normal range of motion.  Lymphadenopathy:     Cervical: No cervical adenopathy.  Skin:    General: Skin is warm and dry.     Findings: No erythema or rash.  Neurological:     Mental Status: He is alert and oriented to person, place, and time.  Psychiatric:        Behavior: Behavior normal.        Thought Content: Thought content normal.        Judgment: Judgment normal.     Lab Results  Component Value Date   WBC 5.9 05/26/2021   HGB 12.8 (L) 05/26/2021   HCT 39.9 05/26/2021   MCV 84.2 05/26/2021   PLT 216 05/26/2021     Chemistry      Component Value Date/Time   NA 137 05/01/2021 0533   K 4.1 05/01/2021 0533   CL 110 05/01/2021 0533   CO2 22 05/01/2021 0533   BUN 16 05/01/2021 0533   CREATININE 0.70 05/01/2021 0533      Component Value Date/Time   CALCIUM 8.8 (L) 05/01/2021 0533   ALKPHOS 64 05/01/2021 0533   AST 21 05/01/2021 0533   ALT 22 05/01/2021 0533   BILITOT 0.6 05/01/2021 0533      Impression and Plan: Sean [name] is a very nice 20 year old African-American male.  He has a growth hormone deficiency.  He had marked anemia when he came to the hospital.  He was iron deficient.  He got IV iron.  He has improved.  He is taking oral iron.  He is taking oral folic acid.  Again his hemoglobin is 4 points higher than when we saw him in the hospital..  I did look at his blood smear.  His blood smear looked relatively benign.  I denies any sickle cells.  He had no target cells.  There was no rouleaux formation.  He had no nucleated red blood cells.  White blood cells were okay immaturity.  He had no hypersegmented polys.  Platelets were adequate number and size.  I really do not think that we have to get  him back to the office unless there is a problem.  He is on the oral iron and oral folic acid.  This clearly has been helping him.  I am just happy that he is doing better.  His quality of life is doing better.  He comes in with his father.  We had a good time.  It was always fun seeing him in the hospital.   Volanda Napoleon, MD 7/12/20223:55 PM

## 2021-05-27 ENCOUNTER — Telehealth: Payer: Self-pay

## 2021-05-27 LAB — FERRITIN: Ferritin: 146 ng/mL (ref 24–336)

## 2021-05-27 LAB — IRON AND TIBC
Iron: 110 ug/dL (ref 42–163)
Saturation Ratios: 41 % (ref 20–55)
TIBC: 271 ug/dL (ref 202–409)
UIBC: 161 ug/dL (ref 117–376)

## 2021-05-27 NOTE — Telephone Encounter (Signed)
No 05/26/21 los noted   Richey Doolittle 

## 2021-05-27 NOTE — Progress Notes (Signed)
Subjective:    Sean [name] - 20 y.o. male MRN [id number]  Date of birth: 09/16/2001  HPI  Sean [name] is to establish care and hospital discharge follow-up. Patient has a PMH significant for patent foramen ovale, near syncope, growth hormone deficiency, lack of expected normal physiological development, delayed bone age, symptomatic anemia, and mild protein-calorie malnutrition. Patient is accompanied by his mother.   Current issues and/or concerns: Visit 04/26/2021 - 05/01/2021 at [facility] per MD note: Recommendations for Outpatient Follow-up:  Follow outpatient CBC/CMP Follow with heme outpatient for further w/u of anemia   Follow pending parvovirus studies Follow pending cytogenetics, FISH in surgical pathology   Discharge Diagnoses:  Principal Problem:   Symptomatic anemia Active Problems:   Growth hormone deficiency (HCC)   Mild protein-calorie malnutrition (HCC)   Near syncope   Discharge Condition: stable   Diet recommendation: heart healthy   History of present illness:  20 y.o. male, with history of asthma, constitutional growth delay, growth hormone deficiency, short stature for age, presented to the ED for chief complaint of near syncope.  Patient apparently has been experiencing generalized weakness and feeling lightheaded for a few days.  He started working at [employer], a [employer], recently.  Apparently they went to urgent care center after he had a near syncopal episode and he was given IV fluids.  She found him paler than usual.  On the day of presentation he was weak and fatigued and once again had a near syncopal episode.  Did not really pass out completely.  Has not noticed any kind of bleeding from any source site recently.  Denies any black stools.  Denies any fever or chills recently.  No acute illness recently.  No previous history of anemia.  No history of sickle cell disease or trait in the family.  Noted to have a hemoglobin of  4.9.  Blood transfusion was initiated and he was hospitalized.   He was found to have severe anemia.  Heme was consulted and workup completed as noted below.  He's now s/p transfusion and IV iron.  Hb now improving.  Follow outpatient.  See below for additional details   Hospital Course:  Severe symptomatic microcytic anemia with near syncope/iron deficiency Hb 4.9 at presentation S/p 3 units pRBC 8.8 today, retics increasing Appreciate heme c/s - plan for outpatient follow up, unclear reason for marrow suppression - nutritional deficiency/developmental issues - needs follow up outpatient - folic acid, iron  Labs c/w iron def anemia S/p feraheme x2 Cold agglutinin negative Parvovirus pending S/p right iliac aspiration and core marrow biopsy - hypocellular marrow with erythroid hyperplacia, normocytic anemia, mild thrombocytopenia - non specific findings, combination of factors (iron def, insult/infection, cessation of growth hormones) (see biopsy report - follow FISH for MDS) - follow outpatient with heme Hgb fractionation cascade/electrophoresis with normal hemoglobin, no hemoglobin variant or beta thalassemia (alpha thal may not be detected by Hbg fractionation cascate panel) Heme c/s, appreciate assistance  Thrombocytopenia resolved  History of hypopituitarism/growth hormone deficiency He is followed by pediatric endocrinology.  Last seen by them in May.  His growth hormone injections were discontinued at that time since his growth had ceased which was consistent with fusion of bones on x-ray of hand.  Plan was to repeat IGF 1 and BP 3 in 3 months.   Systolic murmur Echo with mild to moderate AV sclerosis/calcification   Visit 05/26/2021 at [facility] per MD note:  Sean [name] is a very nice 20 year old African-American male.  He has a growth hormone deficiency.  He had marked anemia when he came to the hospital.  He was iron deficient.  He got IV iron.  He  has improved.  He is taking oral iron.  He is taking oral folic acid.  Again his hemoglobin is 4 points higher than when we saw him in the hospital..  I did look at his blood smear.  His blood smear looked relatively benign.  I denies any sickle cells.  He had no target cells.  There was no rouleaux formation.  He had no nucleated red blood cells.  White blood cells were okay immaturity.  He had no hypersegmented polys.  Platelets were adequate number and size.  I really do not think that we have to get him back to the office unless there is a problem.  He is on the oral iron and oral folic acid.  This clearly has been helping him.  I am just happy that he is doing better.  His quality of life is doing better.  He comes in with his father.  We had a good time.  It was always fun seeing him in the hospital.  05/29/2021: Patient presents today accompanied by his mother. They have no questions and/or concerns. Mother reports patient has current primary provider L. Lupe Carney, MD at [facility]. They are unsure if they will continue care at [facility] or continue care at our office. Requesting refills on folic acid.    ROS per HPI     Health Maintenance:  Health Maintenance Due  Topic Date Due   HPV VACCINES (1 - Male 2-dose series) Never done   Hepatitis C Screening  Never done   COVID-19 Vaccine (3 - Booster for Pfizer series) 08/26/2020   TETANUS/TDAP  Never done     Past Medical History: Patient Active Problem List   Diagnosis Date Noted   Symptomatic anemia 04/26/2021   Mild protein-calorie malnutrition (HCC) 04/26/2021   Near syncope 04/26/2021   Growth hormone deficiency (HCC) 12/01/2015   PFO (patent foramen ovale) 11/12/2015   Lack of expected normal physiological development 12/03/2014   Delayed bone age 53/19/2016    Social History   reports that he has never smoked. He has never used smokeless tobacco. He reports that he does not drink alcohol and does  not use drugs.   Family History  family history includes Delayed puberty in his father.   Medications: reviewed and updated   Objective:   Physical Exam BP 103/63 (BP Location: Left Arm, Patient Position: Sitting, Cuff Size: Normal)   Pulse 70   Temp 98.1 F (36.7 C)   Resp 18   Ht 5' 4.84" (1.647 m)   Wt 111 lb 9.6 oz (50.6 kg)   SpO2 98%   BMI 18.66 kg/m   Physical Exam HENT:     Head: Normocephalic and atraumatic.  Eyes:     Extraocular Movements: Extraocular movements intact.     Conjunctiva/sclera: Conjunctivae normal.     Pupils: Pupils are equal, round, and reactive to light.  Cardiovascular:     Rate and Rhythm: Normal rate and regular rhythm.  Pulmonary:     Effort: Pulmonary effort is normal.     Breath sounds: Normal breath sounds.  Musculoskeletal:     Cervical back: Normal range of motion and neck supple.  Neurological:     Mental Status: He is alert.  Psychiatric:  Mood and Affect: Mood normal.        Behavior: Behavior normal.      Assessment & Plan:  1. Encounter to establish care: - Patient presents today to establish care.  - Return for annual physical examination, labs, and health maintenance. Arrive fasting meaning having no food for at least 8 hours prior to appointment. You may have only water or black coffee. Please take scheduled medications as normal. - Patient's mother reports patient has current primary provider L. Lupe Carney, MD at [facility]. They are unsure if they will continue care at [facility] or continue care at our office.  2. Hospital discharge follow-up - Reviewed hospital course, current medications, ensured proper follow-up in place, and addressed concerns.   3. Symptomatic anemia: 4. Near syncope: - Continue folic acid as prescribed.  - Keep all scheduled appointments with Hematology.  - folic acid (FOLVITE) 1 MG tablet; Take 2 tablets (2 mg total) by mouth daily.  Dispense: 120 tablet; Refill: 1  5.  Mild protein-calorie malnutrition (HCC): 6. Growth hormone deficiency [facility]): - Keep all scheduled appointments with Endocrinology.   7. Aortic valve sclerosis: 8. Aortic valve calcification: - Referral to Cardiology for further evaluation and management.  - Ambulatory referral to Cardiology    Patient was given clear instructions to go to Emergency Department or return to medical center if symptoms don't improve, worsen, or new problems develop.The patient verbalized understanding.  I discussed the assessment and treatment plan with the patient. The patient was provided an opportunity to ask questions and all were answered. The patient agreed with the plan and demonstrated an understanding of the instructions.   The patient was advised to call back or seek an in-person evaluation if the symptoms worsen or if the condition fails to improve as anticipated.    Ricky Stabs, NP 05/29/2021, 3:44 PM Primary Care at [facility]

## 2021-05-29 ENCOUNTER — Encounter: Payer: Self-pay | Admitting: Family

## 2021-05-29 ENCOUNTER — Other Ambulatory Visit: Payer: Self-pay

## 2021-05-29 ENCOUNTER — Ambulatory Visit (INDEPENDENT_AMBULATORY_CARE_PROVIDER_SITE_OTHER): Payer: 59 | Admitting: Family

## 2021-05-29 VITALS — BP 103/63 | HR 70 | Temp 98.1°F | Resp 18 | Ht 64.84 in | Wt 111.6 lb

## 2021-05-29 DIAGNOSIS — I358 Other nonrheumatic aortic valve disorders: Secondary | ICD-10-CM

## 2021-05-29 DIAGNOSIS — Z7689 Persons encountering health services in other specified circumstances: Secondary | ICD-10-CM

## 2021-05-29 DIAGNOSIS — E230 Hypopituitarism: Secondary | ICD-10-CM

## 2021-05-29 DIAGNOSIS — Z09 Encounter for follow-up examination after completed treatment for conditions other than malignant neoplasm: Secondary | ICD-10-CM

## 2021-05-29 DIAGNOSIS — I359 Nonrheumatic aortic valve disorder, unspecified: Secondary | ICD-10-CM

## 2021-05-29 DIAGNOSIS — D649 Anemia, unspecified: Secondary | ICD-10-CM

## 2021-05-29 DIAGNOSIS — R55 Syncope and collapse: Secondary | ICD-10-CM

## 2021-05-29 DIAGNOSIS — E441 Mild protein-calorie malnutrition: Secondary | ICD-10-CM

## 2021-05-29 MED ORDER — FOLIC ACID 1 MG PO TABS: 2.0000 mg | ORAL_TABLET | Freq: Every day | ORAL | 1 refills | Status: DC

## 2021-05-29 NOTE — Progress Notes (Signed)
Pt presents to establish care and hospital discharge from 6/17-6/22. Pt stated feeling fine  Request refill on Folic Acid

## 2021-08-19 ENCOUNTER — Other Ambulatory Visit: Payer: Self-pay

## 2021-08-19 ENCOUNTER — Ambulatory Visit: Payer: 59 | Admitting: Interventional Cardiology

## 2021-08-19 ENCOUNTER — Encounter: Payer: Self-pay | Admitting: Interventional Cardiology

## 2021-08-19 VITALS — BP 98/56 | HR 65 | Ht 64.87 in | Wt 108.4 lb

## 2021-08-19 DIAGNOSIS — Q2112 Patent foramen ovale: Secondary | ICD-10-CM | POA: Diagnosis not present

## 2021-08-19 DIAGNOSIS — D649 Anemia, unspecified: Secondary | ICD-10-CM | POA: Diagnosis not present

## 2021-08-19 NOTE — Progress Notes (Signed)
Cardiology Office Note   Date:  08/19/2021   ID:  Sean [name], DOB June 07, 2001, MRN [id number]  PCP:  Asencion Gowda.August Saucer, MD    No chief complaint on file.  PFO,   Wt Readings from Last 3 Encounters:  08/19/21 108 lb 6.4 oz (49.2 kg) (<1 %, Z= -2.63)*  05/29/21 111 lb 9.6 oz (50.6 kg) (<1 %, Z= -2.35)*  05/26/21 114 lb (51.7 kg) (2 %, Z= -2.17)*   * Growth percentiles are based on CDC (Boys, 2-20 Years) data.       History of Present Illness: Sean [name] is a 20 y.o. male who is being seen today for the evaluation of PFO and aortic calcification report on the echo at the request of Rema Fendt, NP.  He has growth hormone deficiency at baseline and was admitted to the hospital with severe anemia.  Echocardiogram was done.  This revealed PFO.  The report also mention calcification of the aortic valve without stenosis.  Since his anemia has improved, he feels well.  He is a Consulting civil engineer at [employer].  He is not having significant fatigue at this time.  Denies : Chest pain. Dizziness. Leg edema. Nitroglycerin use. Orthopnea. Palpitations. Paroxysmal nocturnal dyspnea. Shortness of breath. Syncope.     Past Medical History:  Diagnosis Date   Asthma    prn inhaler/neb.   Constitutional growth delay    Eczema    Heart murmur    functional, per cardiologist 11/12/2015   Inguinal hernia 11/2015   Seasonal allergies    Short stature for age    Twin birth     Past Surgical History:  Procedure Laterality Date   INGUINAL HERNIA REPAIR Right 11/27/2015   Procedure: HERNIA REPAIR INGUINAL PEDIATRIC;  Surgeon: Leonia Corona, MD;  Location: [facility];  Service: Pediatrics;  Laterality: Right;   ORCHIOPEXY     age 65     Current Outpatient Medications  Medication Sig Dispense Refill   acetaminophen (TYLENOL) 500 MG tablet Take 500 mg by mouth every 6 (six) hours as needed.     albuterol (ACCUNEB) 0.63 MG/3ML nebulizer solution Take 1 ampule by nebulization as  needed. Reported on 03/31/2016     albuterol (PROVENTIL HFA;VENTOLIN HFA) 108 (90 Base) MCG/ACT inhaler Inhale into the lungs every 6 (six) hours as needed for wheezing or shortness of breath. Reported on 03/31/2016     ferrous sulfate 325 (65 FE) MG tablet Take 325 mg by mouth once.     folic acid (FOLVITE) 1 MG tablet Take 2 tablets (2 mg total) by mouth daily. 120 tablet 1   No current facility-administered medications for this visit.    Allergies:   Patient has no known allergies.    Social History:  The patient  reports that he has never smoked. He has never used smokeless tobacco. He reports that he does not drink alcohol and does not use drugs.   Family History:  The patient's family history includes Delayed puberty in his father.    ROS:  Please see the history of present illness.   Otherwise, review of systems are positive for recent anemia- resolved.   All other systems are reviewed and negative.    PHYSICAL EXAM: VS:  BP (!) 98/56   Pulse 65   Ht 5' 4.87" (1.648 m)   Wt 108 lb 6.4 oz (49.2 kg)   SpO2 99%   BMI 18.11 kg/m  , BMI Body mass index is 18.11  kg/m. GEN: Well nourished, smaller in size, in no acute distress HEENT: normal Neck: no JVD, carotid bruits, or masses Cardiac: RRR; no murmurs, rubs, or gallops,no edema  Respiratory:  clear to auscultation bilaterally, normal work of breathing GI: soft, nontender, nondistended, + BS MS: no deformity or atrophy Skin: warm and dry, no rash Neuro:  Strength and sensation are intact Psych: euthymic mood, full affect   EKG:   The ekg ordered today demonstrates    Recent Labs: 04/27/2021: TSH 1.420 05/01/2021: ALT 22; BUN 16; Creatinine, Ser 0.70; Magnesium 2.0; Potassium 4.1; Sodium 137 05/26/2021: Hemoglobin 12.8; Platelet Count 216   Lipid Panel No results found for: CHOL, TRIG, HDL, CHOLHDL, VLDL, LDLCALC, LDLDIRECT   Other studies Reviewed: Additional studies/ records that were reviewed today with results  demonstrating: Echo images personally reviewed..   ASSESSMENT AND PLAN:  PFO: I explained the anatomy of the PFO.  No intervention needed at this time.  He has not had any evidence of paradoxical embolus.  I explained to the mother that 20 to 25% of people have a PFO. Aortic valve: I personally reviewed the echocardiogram.  I do not see any significant calcification.  His aortic valve functions normally.  There is no stenosis and no regurgitation.  No further management needed.  No need for SBE prophylaxis. We also discussed the symptoms of anemia and what to watch for.  If these were to recur such as fatigue or dyspnea on exertion, he should seek attention    Current medicines are reviewed at length with the patient today.  The patient concerns regarding his medicines were addressed.  The following changes have been made:  No change  Labs/ tests ordered today include:  No orders of the defined types were placed in this encounter.   Recommend 150 minutes/week of aerobic exercise Low fat, low carb, high fiber diet recommended  Disposition:   FU in as needed   Signed, Lance Muss, MD  08/19/2021 4:07 PM    [facility] [address] [place], [place], [place]  [address] Phone: [phone]; Fax: [phone]

## 2021-08-19 NOTE — Patient Instructions (Signed)
Medication Instructions:  Your physician recommends that you continue on your current medications as directed. Please refer to the Current Medication list given to you today.  *If you need a refill on your cardiac medications before your next appointment, please call your pharmacy*   Lab Work: none If you have labs (blood work) drawn today and your tests are completely normal, you will receive your results only by: MyChart Message (if you have MyChart) OR A paper copy in the mail If you have any lab test that is abnormal or we need to change your treatment, we will call you to review the results.   Testing/Procedures: none   Follow-Up: At [facility], you and your health needs are our priority.  As part of our continuing mission to provide you with exceptional heart care, we have created designated Provider Care Teams.  These Care Teams include your primary Cardiologist (physician) and Advanced Practice Providers (APPs -  Physician Assistants and Nurse Practitioners) who all work together to provide you with the care you need, when you need it.  We recommend signing up for the patient portal called "MyChart".  Sign up information is provided on this After Visit Summary.  MyChart is used to connect with patients for Virtual Visits (Telemedicine).  Patients are able to view lab/test results, encounter notes, upcoming appointments, etc.  Non-urgent messages can be sent to your provider as well.   To learn more about what you can do with MyChart, go to [email or web address].    Your next appointment:   As needed  The format for your next appointment:   In Person  Provider:   You may see Jayadeep Varanasi, MD or one of the following Advanced Practice Providers on your designated Care Team:   Dayna Dunn, PA-C Michele Lenze, PA-C   Other Instructions   

## 2021-08-26 ENCOUNTER — Other Ambulatory Visit: Payer: Self-pay | Admitting: Family

## 2021-08-26 DIAGNOSIS — D649 Anemia, unspecified: Secondary | ICD-10-CM

## 2021-09-30 ENCOUNTER — Other Ambulatory Visit (INDEPENDENT_AMBULATORY_CARE_PROVIDER_SITE_OTHER): Payer: Self-pay | Admitting: Family

## 2021-09-30 DIAGNOSIS — E230 Hypopituitarism: Secondary | ICD-10-CM

## 2021-10-02 ENCOUNTER — Telehealth (INDEPENDENT_AMBULATORY_CARE_PROVIDER_SITE_OTHER): Payer: 59 | Admitting: Family

## 2021-10-02 ENCOUNTER — Other Ambulatory Visit: Payer: Self-pay

## 2021-10-02 DIAGNOSIS — E230 Hypopituitarism: Secondary | ICD-10-CM | POA: Diagnosis not present

## 2021-10-02 NOTE — Progress Notes (Signed)
as This is a Pediatric Specialist E-Visit consult/follow up provided via My Chart [name] consult today.  Location of patient: [place] is at home Location of provider: Gretchen Short NP Patient was referred by Sean Riley, L.August Saucer, MD   The following participants were involved in this E-Visit: Sean [name] Patient- Sean [name]- NP- Sean [name] CCMA This visit was done via VIDEO   Chief Complain/ Reason for E-Visit today: Growth hormone deficiency.  Total time on call: >20  spent today reviewing the medical chart, counseling the patient/family, and documenting today's visit.   Follow up: 4 months.    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 [name] male   Sean [name] was accompanied by his mother and twin brother   1. Sean [name] was seen by his PMD in 2012 for his annual well child check. The parents were concerned about poor eating, picky eating, and poor growth. They felt that Sean [name] was significantly shorter than other boys his age although he is about the same height as his twin brother. Both boys have had difficulty with getting picked on. They were seen in the fall of 2015 for their 13 year WCC. At that visit their pcp readdressed concerns about poor linear growth and advised them to have repeat bone ages and return to endocrinology. Bone ages were read as 10 years at 13 years 8 months. (Previous study 3 years ago was 7 years). This conveys a predicted adult height around 5'1".   2. His last [facility] visit was on 03/2021. In the interim, he has been generally healthy.   He as seen in the ER on 05/2021 due to fatigue and was diagnosed with Iron deficiency anemia. He is being  followed by hematology currently. He was also seen by cardiology.   He has been off of GH injections for 4 months now after his bone age showed fusion of bones at last visit and height grwoth was less then 2 cm/year. He reports he is feeling well since stopping injections. His appetite and intake continue to be a concern. His parents are trying to get him to broaden his diet especially since he has anemia now. He is taking daily iron and folic acid supplements.     3. Pertinent Review of Systems:  All systems reviewed with pertinent positives listed below; otherwise negative. Constitutional: Sleeping well.  Eyes; Wearing blue glasses. No changes in vision.  HENT: No neck pain. No difficulty swallowing.  Respiratory: No increased work of breathing currently Cardiac: no chest pain. No tachycardia.  GI: No constipation or diarrhea GU: puberty changes as above Musculoskeletal: No joint deformity Neuro: Normal affect Endocrine: As above   PAST MEDICAL, FAMILY, AND SOCIAL HISTORY  Past Medical History:  Diagnosis Date   Asthma    prn inhaler/neb.   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 (Patient not taking: Reported on 10/02/2021), Disp: , Rfl:    albuterol (ACCUNEB) 0.63 MG/3ML nebulizer solution, Take 1 ampule by nebulization as needed. Reported on 03/31/2016 (Patient not taking: Reported on 10/02/2021), Disp: , Rfl:    albuterol (PROVENTIL HFA;VENTOLIN HFA) 108 (90 Base) MCG/ACT inhaler, Inhale into the lungs every 6 (six) hours as needed for wheezing or shortness of breath. Reported on 03/31/2016 (Patient  not taking: Reported on 10/02/2021), Disp: , Rfl:    ferrous sulfate 325 (65 FE) MG tablet, Take 325 mg by mouth once. (Patient not taking: Reported on 10/02/2021), Disp: , Rfl:    folic acid (FOLVITE) 1 MG tablet, Take 2 tablets (2 mg total) by mouth daily. (Patient not taking: Reported on 10/02/2021), Disp: 120 tablet, Rfl: 1  Allergies as of 10/02/2021   (No Known Allergies)     reports that he has never smoked. He has never used smokeless tobacco. He reports that he does not drink alcohol and does not use drugs. Pediatric History  Patient Parents   Sean "Sean [name]" (Mother)   Other Topics Concern   Not on file  Social History Narrative   11 th grade at [employer] [place] Lives with parents twin brother and sister    1. School and Family: Freshman at [name] 2. Activities: Baseball, basketball and track  3. Primary Care Provider: Clovis Riley, L.August Saucer, MD  ROS: There are no other significant problems involving Sean [name]'s other body systems.    Objective:  Objective  Vital Signs:  There were no vitals taken for this visit.  Blood pressure percentiles are not available for patients who are 18 years or older.  Ht Readings from Last 3 Encounters:  08/19/21 5' 4.87" (1.648 m) (5 %, Z= -1.67)*  05/29/21 5' 4.84" (1.647 m) (5 %, Z= -1.67)*  04/26/21 5\' 4"  (1.626 m) (2 %, Z= -1.96)*   * Growth percentiles are based on CDC (Boys, 2-20 Years) data.   Wt Readings from Last 3 Encounters:  08/19/21 108 lb 6.4 oz (49.2 kg) (<1 %, Z= -2.63)*  05/29/21 111 lb 9.6 oz (50.6 kg) (<1 %, Z= -2.35)*  05/26/21 114 lb (51.7 kg) (2 %, Z= -2.17)*   * Growth percentiles are based on CDC (Boys, 2-20 Years) data.   HC Readings from Last 3 Encounters:  08/26/15 21.77" (55.3 cm) (72 %, Z= 0.59)*   * Growth percentiles are based on [place] (Boys, 2-18 Years) data.   There is no height or weight on file to calculate BSA. No height on file for this encounter. No weight on file for this  encounter.    PHYSICAL EXAM:  General: Well developed, well nourished male in no acute distress.   Cardiovascular: No cyanosis.  Respiratory: No increased work of breathing. Skin: warm, dry.  No rash or lesions. Neurologic: alert and oriented, normal speech, no tremor    LAB DATA:   No results found for this or any previous visit (from the past 672 hour(s)). pending    Assessment and Plan:  Assessment  ASSESSMENT:  Sean [name] is a 20 y.o. 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   Height growth has ceased which is consistent with fusion of bones on xray of hand. Will stop growth hormone injections today and monitor labs at 3 and 6 months.   PLAN:  1. Diagnostic: IGF-1  ordered  2. Therapeutic:Off growth hormone  3. Patient education: Discussed all of the above.    4. Follow-up: 4 months.         Sean Short,  FNP-C  Pediatric Specialist  [address]  [place], [address]  Tele: [phone]

## 2021-10-04 ENCOUNTER — Encounter (INDEPENDENT_AMBULATORY_CARE_PROVIDER_SITE_OTHER): Payer: Self-pay | Admitting: Family

## 2021-10-04 DIAGNOSIS — D649 Anemia, unspecified: Secondary | ICD-10-CM | POA: Insufficient documentation

## 2021-10-06 LAB — INSULIN-LIKE GROWTH FACTOR
IGF-I, LC/MS: 239 ng/mL (ref 108–548)
Z-Score (Male): -0.3 SD (ref ?–2.0)

## 2021-10-06 LAB — IGF BINDING PROTEIN 3, BLOOD: IGF Binding Protein 3: 3.9 mg/L (ref 2.9–7.3)

## 2021-12-30 ENCOUNTER — Other Ambulatory Visit: Payer: Self-pay

## 2021-12-30 DIAGNOSIS — D649 Anemia, unspecified: Secondary | ICD-10-CM

## 2021-12-31 ENCOUNTER — Inpatient Hospital Stay: Payer: 59 | Attending: Hematology & Oncology

## 2021-12-31 ENCOUNTER — Other Ambulatory Visit: Payer: Self-pay

## 2021-12-31 ENCOUNTER — Inpatient Hospital Stay: Payer: 59 | Admitting: Hematology & Oncology

## 2021-12-31 ENCOUNTER — Encounter: Payer: Self-pay | Admitting: Hematology & Oncology

## 2021-12-31 VITALS — BP 97/46 | HR 53 | Temp 98.7°F | Resp 18 | Ht 64.0 in | Wt 112.8 lb

## 2021-12-31 DIAGNOSIS — E230 Hypopituitarism: Secondary | ICD-10-CM | POA: Insufficient documentation

## 2021-12-31 DIAGNOSIS — D500 Iron deficiency anemia secondary to blood loss (chronic): Secondary | ICD-10-CM | POA: Diagnosis not present

## 2021-12-31 DIAGNOSIS — D510 Vitamin B12 deficiency anemia due to intrinsic factor deficiency: Secondary | ICD-10-CM | POA: Diagnosis not present

## 2021-12-31 DIAGNOSIS — D649 Anemia, unspecified: Secondary | ICD-10-CM | POA: Diagnosis not present

## 2021-12-31 LAB — CBC WITH DIFFERENTIAL (CANCER CENTER ONLY)
Abs Immature Granulocytes: 0.02 10*3/uL (ref 0.00–0.07)
Basophils Absolute: 0 10*3/uL (ref 0.0–0.1)
Basophils Relative: 1 %
Eosinophils Absolute: 0.2 10*3/uL (ref 0.0–0.5)
Eosinophils Relative: 5 %
HCT: 34.8 % — ABNORMAL LOW (ref 39.0–52.0)
Hemoglobin: 10.6 g/dL — ABNORMAL LOW (ref 13.0–17.0)
Immature Granulocytes: 1 %
Lymphocytes Relative: 35 %
Lymphs Abs: 1.5 10*3/uL (ref 0.7–4.0)
MCH: 22.9 pg — ABNORMAL LOW (ref 26.0–34.0)
MCHC: 30.5 g/dL (ref 30.0–36.0)
MCV: 75.2 fL — ABNORMAL LOW (ref 80.0–100.0)
Monocytes Absolute: 0.4 10*3/uL (ref 0.1–1.0)
Monocytes Relative: 10 %
Neutro Abs: 2.1 10*3/uL (ref 1.7–7.7)
Neutrophils Relative %: 48 %
Platelet Count: 271 10*3/uL (ref 150–400)
RBC: 4.63 MIL/uL (ref 4.22–5.81)
RDW: 20.3 % — ABNORMAL HIGH (ref 11.5–15.5)
WBC Count: 4.3 10*3/uL (ref 4.0–10.5)
nRBC: 0 % (ref 0.0–0.2)

## 2021-12-31 LAB — SAVE SMEAR(SSMR), FOR PROVIDER SLIDE REVIEW

## 2021-12-31 LAB — RETICULOCYTES
Immature Retic Fract: 16.4 % — ABNORMAL HIGH (ref 2.3–15.9)
RBC.: 4.6 MIL/uL (ref 4.22–5.81)
Retic Count, Absolute: 129.3 10*3/uL (ref 19.0–186.0)
Retic Ct Pct: 2.8 % (ref 0.4–3.1)

## 2021-12-31 LAB — LACTATE DEHYDROGENASE: LDH: 106 U/L (ref 98–192)

## 2021-12-31 MED ORDER — MUPIROCIN 2 % EX OINT: TOPICAL_OINTMENT | Freq: Two times a day (BID) | CUTANEOUS | 1 refills | Status: AC

## 2021-12-31 MED ORDER — FUSION PLUS PO CAPS: 1.0000 | ORAL_CAPSULE | Freq: Every day | ORAL | 6 refills | Status: DC

## 2021-12-31 NOTE — Progress Notes (Signed)
Hematology and Oncology Follow Up Visit  Sean [name] [id number] 2001-07-15 21 y.o. 12/31/2021   Principle Diagnosis:  Multifactorial anemia-likely malnutrition/iron deficiency  Current Therapy:   Oral Iron -- Fusion Plus I po q day     Interim History:  Sean [name] is back for follow-up.  Unfortunately, he apparently passed out a few weeks ago.  He went to his family doctor's office.  I am not sure exactly what happened.  I am sure he had lab work done.  Unfortunately, I cannot get hold of the labs that were done.  He says he has been taking the oral iron that he has been given after being in the hospital.  Again is not sure if he truly has been.  He still is not eating all that well.  He said his family caught a meeting frozen pizza.  Has met all that he does eat.  He has had no problems with nausea or vomiting.  He has had no change in bowel or bladder habits.  He is still working.  He is still going to school.  He has had no obvious weight loss or weight gain.  There is been no rashes.  He had no swollen lymph nodes.  He has had no cough.  Thankfully, has been no issues with COVID. \Over last saw him in July of last year, his ferritin of 146 and iron saturation of 41%.  His hemoglobin is 12.8.  Currently, his performance status is probably ECOG 2.  Medications:  Current Outpatient Medications:    acetaminophen (TYLENOL) 500 MG tablet, Take 500 mg by mouth every 6 (six) hours as needed., Disp: , Rfl:    albuterol (ACCUNEB) 0.63 MG/3ML nebulizer solution, Take 1 ampule by nebulization as needed. Reported on 03/31/2016, Disp: , Rfl:    albuterol (PROVENTIL HFA;VENTOLIN HFA) 108 (90 Base) MCG/ACT inhaler, Inhale into the lungs every 6 (six) hours as needed for wheezing or shortness of breath. Reported on 03/31/2016, Disp: , Rfl:    ferrous sulfate 325 (65 FE) MG tablet, Take 325 mg by mouth once., Disp: , Rfl:   Allergies: No Known Allergies  Past Medical History, Surgical  history, Social history, and Family History were reviewed and updated.  Review of Systems: Review of Systems  Constitutional:  Positive for appetite change.  HENT:  Negative.    Eyes: Negative.   Respiratory: Negative.    Cardiovascular: Negative.   Gastrointestinal: Negative.   Endocrine: Negative.   Genitourinary: Negative.    Musculoskeletal: Negative.   Skin: Negative.   Neurological: Negative.   Hematological: Negative.   Psychiatric/Behavioral: Negative.     Physical Exam:  height is '5\' 4"'  (1.626 m) and weight is 112 lb 12.8 oz (51.2 kg). His oral temperature is 98.7 F (37.1 C). His blood pressure is 97/46 (abnormal) and his pulse is 53 (abnormal). His respiration is 18 and oxygen saturation is 100%.   Wt Readings from Last 3 Encounters:  12/31/21 112 lb 12.8 oz (51.2 kg) (1 %, Z= -2.32)*  08/19/21 108 lb 6.4 oz (49.2 kg) (<1 %, Z= -2.63)*  05/29/21 111 lb 9.6 oz (50.6 kg) (<1 %, Z= -2.35)*   * Growth percentiles are based on CDC (Boys, 2-20 Years) data.    Physical Exam Vitals reviewed.  HENT:     Head: Normocephalic and atraumatic.  Eyes:     Pupils: Pupils are equal, round, and reactive to light.  Cardiovascular:     Rate and Rhythm: Normal rate and  regular rhythm.     Heart sounds: Normal heart sounds.  Pulmonary:     Effort: Pulmonary effort is normal.     Breath sounds: Normal breath sounds.  Abdominal:     General: Bowel sounds are normal.     Palpations: Abdomen is soft.  Musculoskeletal:        General: No tenderness or deformity. Normal range of motion.     Cervical back: Normal range of motion.  Lymphadenopathy:     Cervical: No cervical adenopathy.  Skin:    General: Skin is warm and dry.     Findings: No erythema or rash.  Neurological:     Mental Status: He is alert and oriented to person, place, and time.  Psychiatric:        Behavior: Behavior normal.        Thought Content: Thought content normal.        Judgment: Judgment normal.      Lab Results  Component Value Date   WBC 4.3 12/31/2021   HGB 10.6 (L) 12/31/2021   HCT 34.8 (L) 12/31/2021   MCV 75.2 (L) 12/31/2021   PLT 271 12/31/2021     Chemistry      Component Value Date/Time   NA 137 05/01/2021 0533   K 4.1 05/01/2021 0533   CL 110 05/01/2021 0533   CO2 22 05/01/2021 0533   BUN 16 05/01/2021 0533   CREATININE 0.70 05/01/2021 0533      Component Value Date/Time   CALCIUM 8.8 (L) 05/01/2021 0533   ALKPHOS 64 05/01/2021 0533   AST 21 05/01/2021 0533   ALT 22 05/01/2021 0533   BILITOT 0.6 05/01/2021 0533      Impression and Plan: Sean [name] is a very nice 21 year old African-American male.  He has a growth hormone deficiency.  He had marked anemia when he came to the hospital.  He was iron deficient.  He got IV iron.  He has improved.  He is taking oral iron.  He is taking oral folic acid.  I looked at his blood smear.  He clearly has microcytic red blood cells.  His MCV is down to 75.  I am sure his iron is low again.  I will put him on Fusion Plus.  We will see if this may help.  I also recommend that he does have a paronychial infection with his right foot.  I will call in some Bactroban.  I will give him 6 weeks and see if his hemoglobin and iron improve.  If not, I told him we will have to use IV iron.  He would definitely prefer not to do this.  His parents come in with him.  They do a good job and "feeling in the blanks."  Again, I will plan to see him back in 6 weeks.   Volanda Napoleon, MD 2/16/20233:53 PM

## 2022-01-01 ENCOUNTER — Telehealth: Payer: Self-pay | Admitting: *Deleted

## 2022-01-01 LAB — IRON AND IRON BINDING CAPACITY (CC-WL,HP ONLY)
Iron: 119 ug/dL (ref 45–182)
Saturation Ratios: 35 % (ref 17.9–39.5)
TIBC: 336 ug/dL (ref 250–450)
UIBC: 217 ug/dL (ref 117–376)

## 2022-01-01 LAB — FERRITIN: Ferritin: 47 ng/mL (ref 24–336)

## 2022-01-01 NOTE — Telephone Encounter (Signed)
-----   Message from Josph Macho, MD sent at 01/01/2022  1:33 PM EST ----- Call his parents and let them know that the iron levels are actually pretty good.  Hopefully, his hemoglobin will improve with the Fusion Plus.  Cindee Lame

## 2022-01-01 NOTE — Telephone Encounter (Signed)
Per Dr. Myna Hidalgo, I informed the patient's mother that the iron levels are good. Hopefully, the hemoglobin will improve with the Fusion Plus. She verbalized understanding and all questions were answered.

## 2022-02-12 ENCOUNTER — Other Ambulatory Visit: Payer: Self-pay

## 2022-02-12 ENCOUNTER — Inpatient Hospital Stay: Payer: 59 | Admitting: Hematology & Oncology

## 2022-02-12 ENCOUNTER — Inpatient Hospital Stay: Payer: 59 | Attending: Hematology & Oncology

## 2022-02-12 ENCOUNTER — Encounter: Payer: Self-pay | Admitting: Hematology & Oncology

## 2022-02-12 VITALS — BP 114/38 | HR 67 | Temp 98.2°F | Resp 16 | Ht 64.0 in | Wt 115.0 lb

## 2022-02-12 DIAGNOSIS — D508 Other iron deficiency anemias: Secondary | ICD-10-CM | POA: Diagnosis not present

## 2022-02-12 DIAGNOSIS — E230 Hypopituitarism: Secondary | ICD-10-CM

## 2022-02-12 DIAGNOSIS — D500 Iron deficiency anemia secondary to blood loss (chronic): Secondary | ICD-10-CM

## 2022-02-12 DIAGNOSIS — D649 Anemia, unspecified: Secondary | ICD-10-CM | POA: Insufficient documentation

## 2022-02-12 DIAGNOSIS — D510 Vitamin B12 deficiency anemia due to intrinsic factor deficiency: Secondary | ICD-10-CM

## 2022-02-12 LAB — CBC WITH DIFFERENTIAL (CANCER CENTER ONLY)
Abs Immature Granulocytes: 0 10*3/uL (ref 0.00–0.07)
Basophils Absolute: 0 10*3/uL (ref 0.0–0.1)
Basophils Relative: 1 %
Eosinophils Absolute: 0.3 10*3/uL (ref 0.0–0.5)
Eosinophils Relative: 7 %
HCT: 29.1 % — ABNORMAL LOW (ref 39.0–52.0)
Hemoglobin: 8.7 g/dL — ABNORMAL LOW (ref 13.0–17.0)
Immature Granulocytes: 0 %
Lymphocytes Relative: 36 %
Lymphs Abs: 1.5 10*3/uL (ref 0.7–4.0)
MCH: 23.5 pg — ABNORMAL LOW (ref 26.0–34.0)
MCHC: 29.9 g/dL — ABNORMAL LOW (ref 30.0–36.0)
MCV: 78.6 fL — ABNORMAL LOW (ref 80.0–100.0)
Monocytes Absolute: 0.5 10*3/uL (ref 0.1–1.0)
Monocytes Relative: 12 %
Neutro Abs: 1.8 10*3/uL (ref 1.7–7.7)
Neutrophils Relative %: 44 %
Platelet Count: 319 10*3/uL (ref 150–400)
RBC: 3.7 MIL/uL — ABNORMAL LOW (ref 4.22–5.81)
RDW: 16.9 % — ABNORMAL HIGH (ref 11.5–15.5)
WBC Count: 4.1 10*3/uL (ref 4.0–10.5)
nRBC: 0 % (ref 0.0–0.2)

## 2022-02-12 LAB — CMP (CANCER CENTER ONLY)
ALT: 8 U/L (ref 0–44)
AST: 16 U/L (ref 15–41)
Albumin: 4.4 g/dL (ref 3.5–5.0)
Alkaline Phosphatase: 54 U/L (ref 38–126)
Anion gap: 8 (ref 5–15)
BUN: 10 mg/dL (ref 6–20)
CO2: 26 mmol/L (ref 22–32)
Calcium: 9.5 mg/dL (ref 8.9–10.3)
Chloride: 106 mmol/L (ref 98–111)
Creatinine: 0.91 mg/dL (ref 0.61–1.24)
GFR, Estimated: >60 mL/min (ref >60)
Glucose, Bld: 82 mg/dL (ref 70–99)
Potassium: 4.1 mmol/L (ref 3.5–5.1)
Sodium: 140 mmol/L (ref 135–145)
Total Bilirubin: 0.3 mg/dL (ref 0.3–1.2)
Total Protein: 6.7 g/dL (ref 6.5–8.1)

## 2022-02-12 LAB — VITAMIN B12: Vitamin B-12: 615 pg/mL (ref 180–914)

## 2022-02-12 LAB — RETICULOCYTES
Immature Retic Fract: 32.7 % — ABNORMAL HIGH (ref 2.3–15.9)
RBC.: 3.76 MIL/uL — ABNORMAL LOW (ref 4.22–5.81)
Retic Count, Absolute: 154.9 10*3/uL (ref 19.0–186.0)
Retic Ct Pct: 4.1 % — ABNORMAL HIGH (ref 0.4–3.1)

## 2022-02-12 LAB — SAVE SMEAR(SSMR), FOR PROVIDER SLIDE REVIEW

## 2022-02-12 NOTE — Progress Notes (Signed)
?Hematology and Oncology Follow Up Visit ? ?Sean [name] ?[id number] ?Apr 11, 2001 21 y.o. ?02/12/2022 ? ? ?Principle Diagnosis:  ?Multifactorial anemia-likely malnutrition/iron deficiency ? ?Current Therapy:   ?Oral Iron -- Fusion Plus I po q day ?    ?Interim History:  Sean [name] is back for follow-up.  Unfortunately, his hemoglobin continues to drop.  I am not sure as to what is going on.  He says he is not bleeding. ? ?His father comes in with him.  His father is still concerned about his eating habits.  He just does not seem to eat all that much and when he eats, he does not seem to be all that nutritious. ? ?He has not passed.  He is still working.  He is going to school. ? ?He has had no rashes.  Is had no fever.  He has had no cough or shortness of breath. ? ?I am just very puzzled as to why his hemoglobin is dropping. ? ?The last on that we saw him in February, his ferritin was 47 with an iron saturation of 35%. ? ?Today, his vitamin B12 level is 615. ? ?Currently, I would have to say that his performance status is ECOG 1.   ? ? ?Medications:  ?Current Outpatient Medications:  ?  acetaminophen (TYLENOL) 500 MG tablet, Take 500 mg by mouth every 6 (six) hours as needed., Disp: , Rfl:  ?  albuterol (ACCUNEB) 0.63 MG/3ML nebulizer solution, Take 1 ampule by nebulization as needed. Reported on 03/31/2016, Disp: , Rfl:  ?  albuterol (PROVENTIL HFA;VENTOLIN HFA) 108 (90 Base) MCG/ACT inhaler, Inhale into the lungs every 6 (six) hours as needed for wheezing or shortness of breath. Reported on 03/31/2016, Disp: , Rfl:  ?  ferrous sulfate 325 (65 FE) MG tablet, Take 325 mg by mouth once., Disp: , Rfl:  ?  folic acid (FOLVITE) 1 MG tablet, Take 2 mg by mouth daily., Disp: , Rfl:  ?  Iron-FA-B Cmp-C-Biot-Probiotic (FUSION PLUS) CAPS, Take 1 tablet by mouth daily after breakfast., Disp: 30 capsule, Rfl: 6 ?  mupirocin ointment (BACTROBAN) 2 %, Apply topically 2 (two) times daily. Apply to your toe TWICE a day, Disp: 30 g,  Rfl: 1 ? ?Allergies: No Known Allergies ? ?Past Medical History, Surgical history, Social history, and Family History were reviewed and updated. ? ?Review of Systems: ?Review of Systems  ?Constitutional:  Positive for appetite change.  ?HENT:  Negative.    ?Eyes: Negative.   ?Respiratory: Negative.    ?Cardiovascular: Negative.   ?Gastrointestinal: Negative.   ?Endocrine: Negative.   ?Genitourinary: Negative.    ?Musculoskeletal: Negative.   ?Skin: Negative.   ?Neurological: Negative.   ?Hematological: Negative.   ?Psychiatric/Behavioral: Negative.    ? ?Physical Exam: ? height is 5\' 4"  (1.626 m) and weight is 115 lb (52.2 kg). His oral temperature is 98.2 ?F (36.8 ?C). His blood pressure is 114/38 (abnormal) and his pulse is 67. His respiration is 16 and oxygen saturation is 100%.  ? ?Wt Readings from Last 3 Encounters:  ?02/12/22 115 lb (52.2 kg)  ?12/31/21 112 lb 12.8 oz (51.2 kg) (1 %, Z= -2.32)*  ?08/19/21 108 lb 6.4 oz (49.2 kg) (<1 %, Z= -2.63)*  ? ?* Growth percentiles are based on CDC (Boys, 2-20 Years) data.  ? ? ?Physical Exam ?Vitals reviewed.  ?HENT:  ?   Head: Normocephalic and atraumatic.  ?Eyes:  ?   Pupils: Pupils are equal, round, and reactive to light.  ?Cardiovascular:  ?  Rate and Rhythm: Normal rate and regular rhythm.  ?   Heart sounds: Normal heart sounds.  ?Pulmonary:  ?   Effort: Pulmonary effort is normal.  ?   Breath sounds: Normal breath sounds.  ?Abdominal:  ?   General: Bowel sounds are normal.  ?   Palpations: Abdomen is soft.  ?Musculoskeletal:     ?   General: No tenderness or deformity. Normal range of motion.  ?   Cervical back: Normal range of motion.  ?Lymphadenopathy:  ?   Cervical: No cervical adenopathy.  ?Skin: ?   General: Skin is warm and dry.  ?   Findings: No erythema or rash.  ?Neurological:  ?   Mental Status: He is alert and oriented to person, place, and time.  ?Psychiatric:     ?   Behavior: Behavior normal.     ?   Thought Content: Thought content normal.     ?    Judgment: Judgment normal.  ? ? ? ?Lab Results  ?Component Value Date  ? WBC 4.1 02/12/2022  ? HGB 8.7 (L) 02/12/2022  ? HCT 29.1 (L) 02/12/2022  ? MCV 78.6 (L) 02/12/2022  ? PLT 319 02/12/2022  ? ?  Chemistry   ?   ?Component Value Date/Time  ? NA 137 05/01/2021 0533  ? K 4.1 05/01/2021 0533  ? CL 110 05/01/2021 0533  ? CO2 22 05/01/2021 0533  ? BUN 16 05/01/2021 0533  ? CREATININE 0.70 05/01/2021 0533  ?    ?Component Value Date/Time  ? CALCIUM 8.8 (L) 05/01/2021 0533  ? ALKPHOS 64 05/01/2021 0533  ? AST 21 05/01/2021 0533  ? ALT 22 05/01/2021 0533  ? BILITOT 0.6 05/01/2021 0533  ?  ? ? ?Impression and Plan: ?Sean [name] is a very nice 21 year old African-American male.  He has a growth hormone deficiency.  He has not had any growth hormone replacement for a couple years.  He is on oral iron.  He is on folic acid.  He says he is taking both. ? ?Again, I have to believe that is going to be iron deficient.  He is MCV is low. ? ?Of note, his erythropoietin level was only 30 when checked a year ago.  I suppose we could always consider him for ESA to try to get his hemoglobin up depending on his iron studies. ? ?I wonder if he has testosterone deficiency.  We will be checking this also. ? ?His corrected reticulocyte count is not that bad. ? ?I do not think that a repeat bone marrow test would help [place] out right now. ? ?I am thought about the possibility of doing a rectal exam on him.  We will have to see what his iron studies show. ? ?I would have to get him back in about 4 weeks.  Again, this is quite puzzling.  However, we will figure out what is going on. ? ?He certainly seems to be compensated well for the anemia. ? ? ?Volanda Napoleon, MD ?3/31/20233:34 PM  ?

## 2022-02-15 LAB — IRON AND IRON BINDING CAPACITY (CC-WL,HP ONLY)
Iron: 276 ug/dL — ABNORMAL HIGH (ref 45–182)
Saturation Ratios: 78 % — ABNORMAL HIGH (ref 17.9–39.5)
TIBC: 353 ug/dL (ref 250–450)
UIBC: 77 ug/dL — ABNORMAL LOW (ref 117–376)

## 2022-02-15 LAB — FERRITIN: Ferritin: 23 ng/mL — ABNORMAL LOW (ref 24–336)

## 2022-02-16 LAB — TESTOSTERONE: Testosterone: 456 ng/dL (ref 264–916)

## 2022-02-24 ENCOUNTER — Other Ambulatory Visit: Payer: Self-pay | Admitting: Family Medicine

## 2022-03-10 ENCOUNTER — Inpatient Hospital Stay: Payer: 59 | Attending: Hematology & Oncology

## 2022-03-10 ENCOUNTER — Inpatient Hospital Stay: Payer: 59 | Admitting: Hematology & Oncology

## 2022-03-10 ENCOUNTER — Encounter: Payer: Self-pay | Admitting: Hematology & Oncology

## 2022-03-10 ENCOUNTER — Other Ambulatory Visit: Payer: Self-pay

## 2022-03-10 VITALS — BP 110/61 | HR 72 | Temp 98.5°F | Resp 17 | Wt 114.0 lb

## 2022-03-10 DIAGNOSIS — D508 Other iron deficiency anemias: Secondary | ICD-10-CM

## 2022-03-10 DIAGNOSIS — D649 Anemia, unspecified: Secondary | ICD-10-CM | POA: Insufficient documentation

## 2022-03-10 DIAGNOSIS — E230 Hypopituitarism: Secondary | ICD-10-CM | POA: Diagnosis not present

## 2022-03-10 LAB — CMP (CANCER CENTER ONLY)
ALT: 7 U/L (ref 0–44)
AST: 17 U/L (ref 15–41)
Albumin: 4.6 g/dL (ref 3.5–5.0)
Alkaline Phosphatase: 49 U/L (ref 38–126)
Anion gap: 9 (ref 5–15)
BUN: 13 mg/dL (ref 6–20)
CO2: 25 mmol/L (ref 22–32)
Calcium: 9.7 mg/dL (ref 8.9–10.3)
Chloride: 106 mmol/L (ref 98–111)
Creatinine: 0.94 mg/dL (ref 0.61–1.24)
GFR, Estimated: >60 mL/min (ref >60)
Glucose, Bld: 103 mg/dL — ABNORMAL HIGH (ref 70–99)
Potassium: 3.7 mmol/L (ref 3.5–5.1)
Sodium: 140 mmol/L (ref 135–145)
Total Bilirubin: 0.2 mg/dL — ABNORMAL LOW (ref 0.3–1.2)
Total Protein: 7.1 g/dL (ref 6.5–8.1)

## 2022-03-10 LAB — RETICULOCYTES
Immature Retic Fract: 7.3 % (ref 2.3–15.9)
RBC.: 5.35 MIL/uL (ref 4.22–5.81)
Retic Count, Absolute: 42.3 10*3/uL (ref 19.0–186.0)
Retic Ct Pct: 0.8 % (ref 0.4–3.1)

## 2022-03-10 LAB — CBC WITH DIFFERENTIAL (CANCER CENTER ONLY)
Abs Immature Granulocytes: 0.01 10*3/uL (ref 0.00–0.07)
Basophils Absolute: 0.1 10*3/uL (ref 0.0–0.1)
Basophils Relative: 1 %
Eosinophils Absolute: 0.3 10*3/uL (ref 0.0–0.5)
Eosinophils Relative: 7 %
HCT: 40.7 % (ref 39.0–52.0)
Hemoglobin: 12.4 g/dL — ABNORMAL LOW (ref 13.0–17.0)
Immature Granulocytes: 0 %
Lymphocytes Relative: 32 %
Lymphs Abs: 1.4 10*3/uL (ref 0.7–4.0)
MCH: 23.3 pg — ABNORMAL LOW (ref 26.0–34.0)
MCHC: 30.5 g/dL (ref 30.0–36.0)
MCV: 76.5 fL — ABNORMAL LOW (ref 80.0–100.0)
Monocytes Absolute: 0.4 10*3/uL (ref 0.1–1.0)
Monocytes Relative: 10 %
Neutro Abs: 2.3 10*3/uL (ref 1.7–7.7)
Neutrophils Relative %: 50 %
Platelet Count: 254 10*3/uL (ref 150–400)
RBC: 5.32 MIL/uL (ref 4.22–5.81)
RDW: 15.2 % (ref 11.5–15.5)
WBC Count: 4.5 10*3/uL (ref 4.0–10.5)
nRBC: 0 % (ref 0.0–0.2)

## 2022-03-10 LAB — VITAMIN B12: Vitamin B-12: 2289 pg/mL — ABNORMAL HIGH (ref 180–914)

## 2022-03-10 LAB — LACTATE DEHYDROGENASE: LDH: 117 U/L (ref 98–192)

## 2022-03-10 LAB — FERRITIN: Ferritin: 22 ng/mL — ABNORMAL LOW (ref 24–336)

## 2022-03-10 NOTE — Progress Notes (Signed)
?Hematology and Oncology Follow Up Visit ? ?CLEMENCE STILLINGS ?[id number] ?Jan 31, 2001 21 y.o. ?03/10/2022 ? ? ?Principle Diagnosis:  ?Multifactorial anemia-likely malnutrition/iron deficiency ? ?Current Therapy:   ?Oral Iron -- Fusion Plus I po q day ?    ?Interim History:  Mr. Sean [name] is back for follow-up.  I am absolutely amazed as to the rise in his hemoglobin.  He is eating better.  He is taking the oral iron.  His hemoglobin is now 12.4. ? ?He feels better.  Has more energy.  He is in school right now.  He does have a job.  He has finals coming up in a week or so. ? ?He has had no problems with nausea and vomiting.  Again he seems to be eating more variety of food. ? ?He has had no issues with bowels or bladder.  He has had no fever.  He has had no cough or shortness of breath.  He has had no mouth sores. ? ?Overall, his performance status is ECOG 0.     ? ? ?Medications:  ?Current Outpatient Medications:  ?  acetaminophen (TYLENOL) 500 MG tablet, Take 500 mg by mouth every 6 (six) hours as needed., Disp: , Rfl:  ?  albuterol (ACCUNEB) 0.63 MG/3ML nebulizer solution, Take 1 ampule by nebulization as needed. Reported on 03/31/2016, Disp: , Rfl:  ?  albuterol (PROVENTIL HFA;VENTOLIN HFA) 108 (90 Base) MCG/ACT inhaler, Inhale into the lungs every 6 (six) hours as needed for wheezing or shortness of breath. Reported on 03/31/2016, Disp: , Rfl:  ?  ferrous sulfate 325 (65 FE) MG tablet, Take 325 mg by mouth once., Disp: , Rfl:  ?  folic acid (FOLVITE) 1 MG tablet, Take 2 mg by mouth daily., Disp: , Rfl:  ?  Iron-FA-B Cmp-C-Biot-Probiotic (FUSION PLUS) CAPS, Take 1 tablet by mouth daily after breakfast., Disp: 30 capsule, Rfl: 6 ?  mupirocin ointment (BACTROBAN) 2 %, Apply topically 2 (two) times daily. Apply to your toe TWICE a day, Disp: 30 g, Rfl: 1 ? ?Allergies: No Known Allergies ? ?Past Medical History, Surgical history, Social history, and Family History were reviewed and updated. ? ?Review of Systems: ?Review of  Systems  ?Constitutional:  Positive for appetite change.  ?HENT:  Negative.    ?Eyes: Negative.   ?Respiratory: Negative.    ?Cardiovascular: Negative.   ?Gastrointestinal: Negative.   ?Endocrine: Negative.   ?Genitourinary: Negative.    ?Musculoskeletal: Negative.   ?Skin: Negative.   ?Neurological: Negative.   ?Hematological: Negative.   ?Psychiatric/Behavioral: Negative.    ? ?Physical Exam: ? weight is 114 lb (51.7 kg). His oral temperature is 98.5 ?F (36.9 ?C). His blood pressure is 110/61 and his pulse is 72. His respiration is 17 and oxygen saturation is 100%.  ? ?Wt Readings from Last 3 Encounters:  ?03/10/22 114 lb (51.7 kg)  ?02/12/22 115 lb (52.2 kg)  ?12/31/21 112 lb 12.8 oz (51.2 kg) (1 %, Z= -2.32)*  ? ?* Growth percentiles are based on CDC (Boys, 2-20 Years) data.  ? ? ?Physical Exam ?Vitals reviewed.  ?HENT:  ?   Head: Normocephalic and atraumatic.  ?Eyes:  ?   Pupils: Pupils are equal, round, and reactive to light.  ?Cardiovascular:  ?   Rate and Rhythm: Normal rate and regular rhythm.  ?   Heart sounds: Normal heart sounds.  ?Pulmonary:  ?   Effort: Pulmonary effort is normal.  ?   Breath sounds: Normal breath sounds.  ?Abdominal:  ?   General:  Bowel sounds are normal.  ?   Palpations: Abdomen is soft.  ?Musculoskeletal:     ?   General: No tenderness or deformity. Normal range of motion.  ?   Cervical back: Normal range of motion.  ?Lymphadenopathy:  ?   Cervical: No cervical adenopathy.  ?Skin: ?   General: Skin is warm and dry.  ?   Findings: No erythema or rash.  ?Neurological:  ?   Mental Status: He is alert and oriented to person, place, and time.  ?Psychiatric:     ?   Behavior: Behavior normal.     ?   Thought Content: Thought content normal.     ?   Judgment: Judgment normal.  ? ? ? ?Lab Results  ?Component Value Date  ? WBC 4.5 03/10/2022  ? HGB 12.4 (L) 03/10/2022  ? HCT 40.7 03/10/2022  ? MCV 76.5 (L) 03/10/2022  ? PLT 254 03/10/2022  ? ?  Chemistry   ?   ?Component Value Date/Time  ?  NA 140 03/10/2022 1443  ? K 3.7 03/10/2022 1443  ? CL 106 03/10/2022 1443  ? CO2 25 03/10/2022 1443  ? BUN 13 03/10/2022 1443  ? CREATININE 0.94 03/10/2022 1443  ?    ?Component Value Date/Time  ? CALCIUM 9.7 03/10/2022 1443  ? ALKPHOS 49 03/10/2022 1443  ? AST 17 03/10/2022 1443  ? ALT 7 03/10/2022 1443  ? BILITOT 0.2 (L) 03/10/2022 1443  ?  ? ? ?Impression and Plan: ?Mr. Campos is a very nice 21 year old African-American male.  He has a growth hormone deficiency.  He has not had any growth hormone replacement for a couple years.  He is on oral iron.  He is on folic acid.  He says he is taking both. ? ?Again, his hemoglobin is much better.  I am surprised that the MCV is not much better.  I suspect that there might be a hemoglobinopathy. ? ?We will not make any changes.  He is eating more variety in his diet.  He is taking the oral iron and the folic acid.  We do not have to give any IV iron. ? ?He does not need any ESA.  His erythropoietin level is on the lower side at 30. ? ?We did check a testosterone level on him when we saw him last.  It was 456. ? ?I think we can try to get him back after he finishes summer school and before the Fall semester starts in college.  This should be in early August. ? ? ?Josph Macho, MD ?4/26/20234:19 PM  ?

## 2022-03-11 LAB — IRON AND IRON BINDING CAPACITY (CC-WL,HP ONLY)
Iron: 147 ug/dL (ref 45–182)
Saturation Ratios: 42 % — ABNORMAL HIGH (ref 17.9–39.5)
TIBC: 350 ug/dL (ref 250–450)
UIBC: 203 ug/dL (ref 117–376)

## 2022-03-11 LAB — COPPER, SERUM: Copper: 84 ug/dL (ref 63–121)

## 2022-03-24 ENCOUNTER — Other Ambulatory Visit: Payer: Self-pay | Admitting: Family Medicine

## 2022-06-14 ENCOUNTER — Other Ambulatory Visit: Payer: Self-pay | Admitting: Family Medicine

## 2022-06-14 ENCOUNTER — Other Ambulatory Visit: Payer: Self-pay | Admitting: Hematology & Oncology

## 2022-06-14 NOTE — Telephone Encounter (Signed)
Schedule appointment?

## 2022-06-23 ENCOUNTER — Inpatient Hospital Stay: Payer: 59 | Admitting: Hematology & Oncology

## 2022-06-23 ENCOUNTER — Inpatient Hospital Stay: Payer: 59 | Attending: Hematology & Oncology

## 2022-06-23 ENCOUNTER — Encounter: Payer: Self-pay | Admitting: Hematology & Oncology

## 2022-06-23 ENCOUNTER — Other Ambulatory Visit: Payer: Self-pay

## 2022-06-23 VITALS — BP 105/47 | HR 72 | Temp 98.4°F | Resp 18 | Ht 64.0 in | Wt 113.0 lb

## 2022-06-23 DIAGNOSIS — Q2112 Patent foramen ovale: Secondary | ICD-10-CM | POA: Diagnosis not present

## 2022-06-23 DIAGNOSIS — E230 Hypopituitarism: Secondary | ICD-10-CM | POA: Insufficient documentation

## 2022-06-23 DIAGNOSIS — D649 Anemia, unspecified: Secondary | ICD-10-CM

## 2022-06-23 LAB — CBC WITH DIFFERENTIAL (CANCER CENTER ONLY)
Abs Immature Granulocytes: 0.02 10*3/uL (ref 0.00–0.07)
Basophils Absolute: 0 10*3/uL (ref 0.0–0.1)
Basophils Relative: 1 %
Eosinophils Absolute: 0.5 10*3/uL (ref 0.0–0.5)
Eosinophils Relative: 8 %
HCT: 43.7 % (ref 39.0–52.0)
Hemoglobin: 13.6 g/dL (ref 13.0–17.0)
Immature Granulocytes: 0 %
Lymphocytes Relative: 21 %
Lymphs Abs: 1.4 10*3/uL (ref 0.7–4.0)
MCH: 22.3 pg — ABNORMAL LOW (ref 26.0–34.0)
MCHC: 31.1 g/dL (ref 30.0–36.0)
MCV: 71.6 fL — ABNORMAL LOW (ref 80.0–100.0)
Monocytes Absolute: 0.6 10*3/uL (ref 0.1–1.0)
Monocytes Relative: 9 %
Neutro Abs: 3.9 10*3/uL (ref 1.7–7.7)
Neutrophils Relative %: 61 %
Platelet Count: 225 10*3/uL (ref 150–400)
RBC: 6.1 MIL/uL — ABNORMAL HIGH (ref 4.22–5.81)
RDW: 18.6 % — ABNORMAL HIGH (ref 11.5–15.5)
WBC Count: 6.4 10*3/uL (ref 4.0–10.5)
nRBC: 0 % (ref 0.0–0.2)

## 2022-06-23 LAB — CMP (CANCER CENTER ONLY)
ALT: 9 U/L (ref 0–44)
AST: 15 U/L (ref 15–41)
Albumin: 5 g/dL (ref 3.5–5.0)
Alkaline Phosphatase: 53 U/L (ref 38–126)
Anion gap: 9 (ref 5–15)
BUN: 12 mg/dL (ref 6–20)
CO2: 26 mmol/L (ref 22–32)
Calcium: 10.3 mg/dL (ref 8.9–10.3)
Chloride: 103 mmol/L (ref 98–111)
Creatinine: 1.11 mg/dL (ref 0.61–1.24)
GFR, Estimated: >60 mL/min (ref >60)
Glucose, Bld: 87 mg/dL (ref 70–99)
Potassium: 4.3 mmol/L (ref 3.5–5.1)
Sodium: 138 mmol/L (ref 135–145)
Total Bilirubin: 0.4 mg/dL (ref 0.3–1.2)
Total Protein: 7.3 g/dL (ref 6.5–8.1)

## 2022-06-23 LAB — RETICULOCYTES
Immature Retic Fract: 8.9 % (ref 2.3–15.9)
RBC.: 6.16 MIL/uL — ABNORMAL HIGH (ref 4.22–5.81)
Retic Count, Absolute: 59.1 10*3/uL (ref 19.0–186.0)
Retic Ct Pct: 1 % (ref 0.4–3.1)

## 2022-06-23 LAB — FERRITIN: Ferritin: 74 ng/mL (ref 24–336)

## 2022-06-23 MED ORDER — FOLIC ACID 1 MG PO TABS: 2.0000 mg | ORAL_TABLET | Freq: Every day | ORAL | 6 refills | Status: DC

## 2022-06-23 NOTE — Progress Notes (Signed)
Hematology and Oncology Follow Up Visit  Sean [name] [id number] 06/27/2001 21 y.o. 06/23/2022   Principle Diagnosis:  Multifactorial anemia-likely malnutrition/iron deficiency  Current Therapy:   Oral Iron -- Fusion Plus I po q day Folic acid 2 mg p.o. daily     Interim History:  Sean [name] is back for follow-up.  He continues to do quite well.  He is working at [employer].  He works as a Conservation officer, nature.  He will start school or college in a week or so.  He is eating better.  He is eating more variety of food.  I think this proves the fact that he is getting more nutrients in and get his iron level better.  He has had no bleeding.  He has had no nausea or vomiting.  There is been no cough or shortness of breath.  He has had no leg swelling.  He has had no rashes.  His last iron studies done back in April showed a ferritin of 22 with an iron saturation of 42%.  We did a vitamin B12 level on him.  This was elevated 2290.  I am not worried about this.  We also did a copper level on him.  Copper was 84.  Currently, I would have said that his performance status is ECOG 1.   Medications:  Current Outpatient Medications:    acetaminophen (TYLENOL) 500 MG tablet, Take 500 mg by mouth every 6 (six) hours as needed., Disp: , Rfl:    albuterol (ACCUNEB) 0.63 MG/3ML nebulizer solution, Take 1 ampule by nebulization as needed. Reported on 03/31/2016, Disp: , Rfl:    albuterol (PROVENTIL HFA;VENTOLIN HFA) 108 (90 Base) MCG/ACT inhaler, Inhale into the lungs every 6 (six) hours as needed for wheezing or shortness of breath. Reported on 03/31/2016, Disp: , Rfl:    ferrous sulfate 325 (65 FE) MG tablet, Take 325 mg by mouth once., Disp: , Rfl:    folic acid (FOLVITE) 1 MG tablet, Take 2 mg by mouth daily., Disp: , Rfl:    Iron-FA-B Cmp-C-Biot-Probiotic (FUSION PLUS) CAPS, Take 1 tablet by mouth daily after breakfast., Disp: 30 capsule, Rfl: 5   mupirocin ointment (BACTROBAN) 2 %, Apply topically 2  (two) times daily. Apply to your toe TWICE a day, Disp: 30 g, Rfl: 1  Allergies: No Known Allergies  Past Medical History, Surgical history, Social history, and Family History were reviewed and updated.  Review of Systems: Review of Systems  Constitutional:  Positive for appetite change.  HENT:  Negative.    Eyes: Negative.   Respiratory: Negative.    Cardiovascular: Negative.   Gastrointestinal: Negative.   Endocrine: Negative.   Genitourinary: Negative.    Musculoskeletal: Negative.   Skin: Negative.   Neurological: Negative.   Hematological: Negative.   Psychiatric/Behavioral: Negative.      Physical Exam:  height is 5\' 4"  (1.626 m) and weight is 113 lb (51.3 kg). His oral temperature is 98.4 F (36.9 C). His blood pressure is 105/47 (abnormal) and his pulse is 72. His respiration is 18 and oxygen saturation is 100%.   Wt Readings from Last 3 Encounters:  06/23/22 113 lb (51.3 kg)  03/10/22 114 lb (51.7 kg)  02/12/22 115 lb (52.2 kg)    Physical Exam Vitals reviewed.  HENT:     Head: Normocephalic and atraumatic.  Eyes:     Pupils: Pupils are equal, round, and reactive to light.  Cardiovascular:     Rate and Rhythm: Normal rate and regular  rhythm.     Heart sounds: Normal heart sounds.  Pulmonary:     Effort: Pulmonary effort is normal.     Breath sounds: Normal breath sounds.  Abdominal:     General: Bowel sounds are normal.     Palpations: Abdomen is soft.  Musculoskeletal:        General: No tenderness or deformity. Normal range of motion.     Cervical back: Normal range of motion.  Lymphadenopathy:     Cervical: No cervical adenopathy.  Skin:    General: Skin is warm and dry.     Findings: No erythema or rash.  Neurological:     Mental Status: He is alert and oriented to person, place, and time.  Psychiatric:        Behavior: Behavior normal.        Thought Content: Thought content normal.        Judgment: Judgment normal.      Lab Results   Component Value Date   WBC 6.4 06/23/2022   HGB 13.6 06/23/2022   HCT 43.7 06/23/2022   MCV 71.6 (L) 06/23/2022   PLT 225 06/23/2022     Chemistry      Component Value Date/Time   NA 138 06/23/2022 1432   K 4.3 06/23/2022 1432   CL 103 06/23/2022 1432   CO2 26 06/23/2022 1432   BUN 12 06/23/2022 1432   CREATININE 1.11 06/23/2022 1432      Component Value Date/Time   CALCIUM 10.3 06/23/2022 1432   ALKPHOS 53 06/23/2022 1432   AST 15 06/23/2022 1432   ALT 9 06/23/2022 1432   BILITOT 0.4 06/23/2022 1432      Impression and Plan: Sean [name] is a very nice 21 year old African-American male.  He has a growth hormone deficiency.  He has not had any growth hormone replacement for a couple years.  He is on oral iron.  He is on folic acid.  He says he is taking both.  Again, I really think he is doing nicely.  His hemoglobin keeps going on.  As such, I do not think we have to make any changes with the oral iron over the oral folic acid.  We will plan to get him back to see [place] in another 4 to 5 months.  I think if his blood counts are stable at that point, then we could let him go from the clinic.   Josph Macho, MD 8/9/20233:37 PM

## 2022-06-24 ENCOUNTER — Encounter: Payer: Self-pay | Admitting: *Deleted

## 2022-06-24 LAB — IRON AND IRON BINDING CAPACITY (CC-WL,HP ONLY)
Iron: 84 ug/dL (ref 45–182)
Saturation Ratios: 30 % (ref 17.9–39.5)
TIBC: 283 ug/dL (ref 250–450)
UIBC: 199 ug/dL (ref 117–376)

## 2022-09-22 IMAGING — CT CT CHEST-ABD-PELV W/O CM
2 of 4 series · 14 of 36 positions shown, 16 images · non-contrast
Comparison: Scrotal ultrasound 10/07/2015

CLINICAL DATA: Recurrent near syncopal episodes with anemia of
unknown etiology. History of asthma and constitutional growth
delay/growth hormone deficiency. History of undescended testicles.

EXAM:
CT CHEST, ABDOMEN AND PELVIS WITHOUT CONTRAST
TECHNIQUE: Multidetector CT imaging of the chest, abdomen and pelvis was
performed following the standard protocol without IV contrast.

[Series 2: cap w/o · axial · non-contrast · 0.67mm/px · z∈[+1107,+1617]mm · 11 of 124 slices shown, 13 images]
[im 11/124  mediastinal]
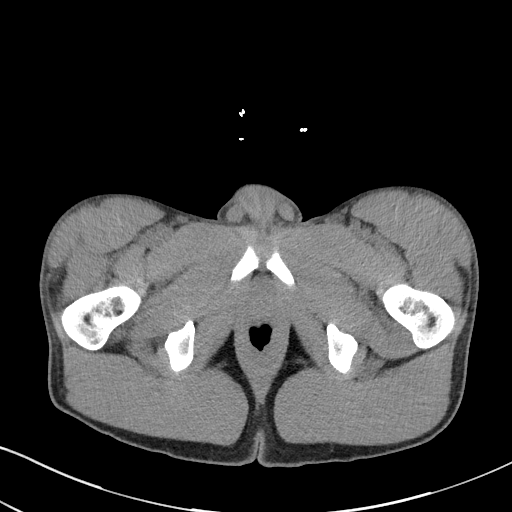
[im 11/124  bone]
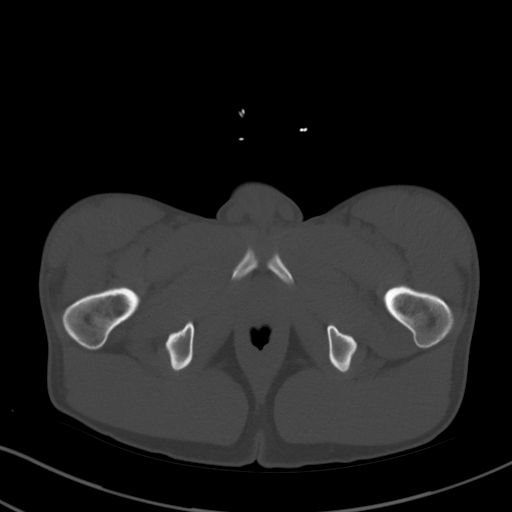
[im 21/124  mediastinal]
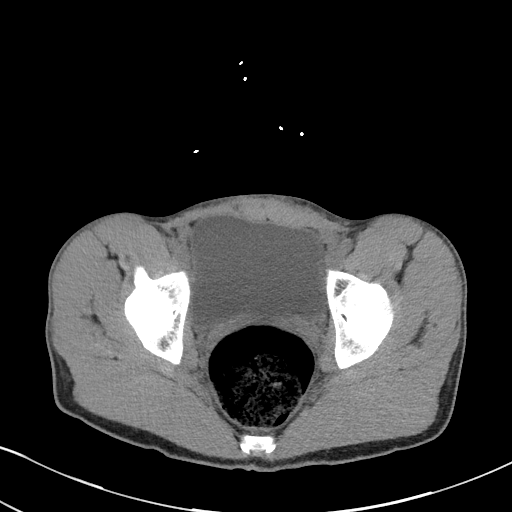
[im 31/124  mediastinal]
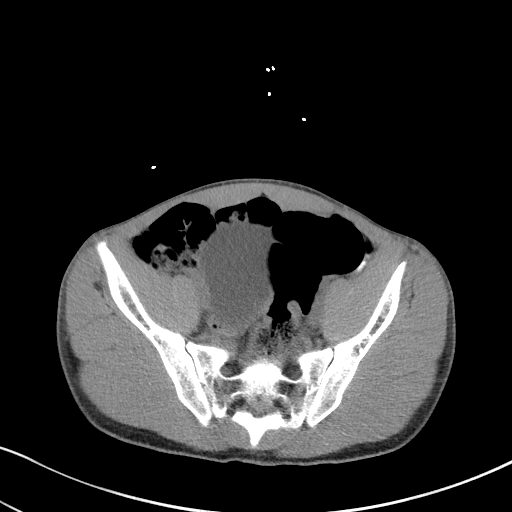
[im 42/124  mediastinal]
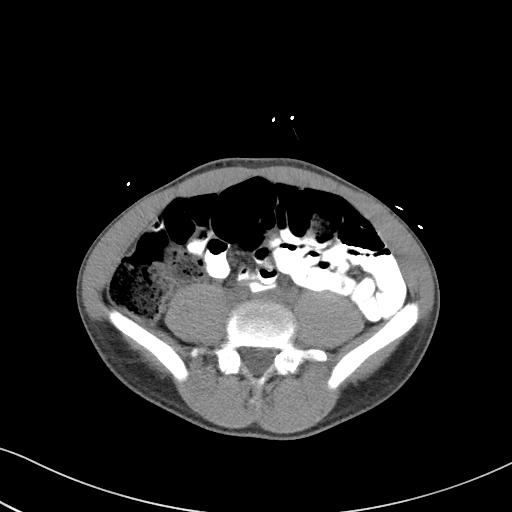
[im 52/124  mediastinal]
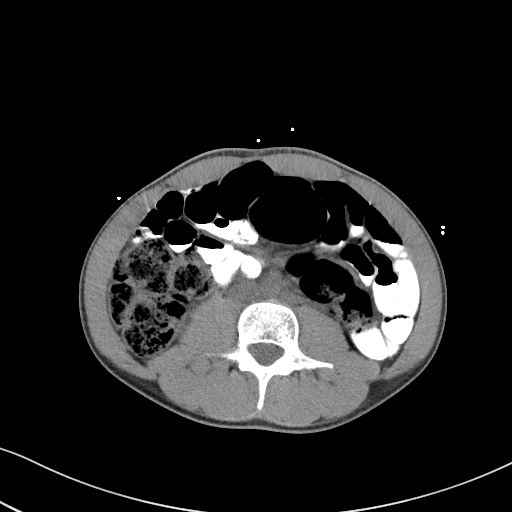
[im 62/124  mediastinal]
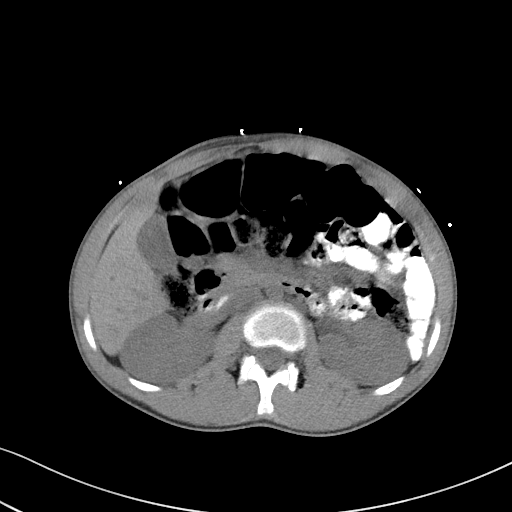
[im 72/124  mediastinal]
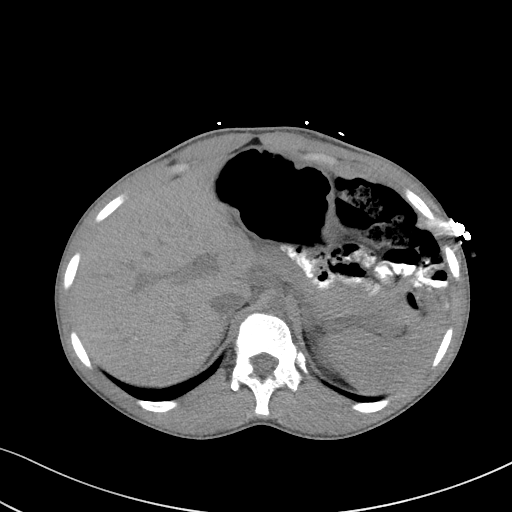
[im 83/124  mediastinal]
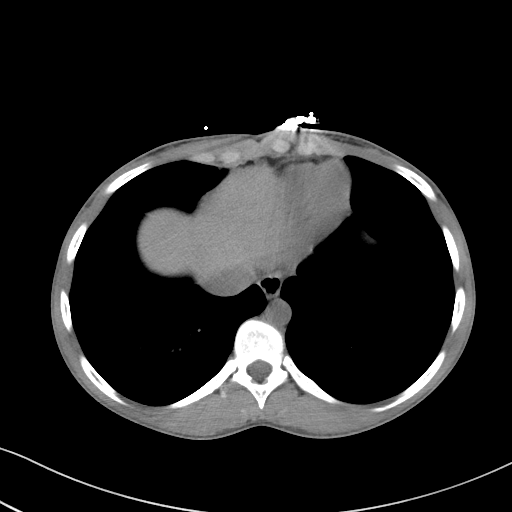
[im 93/124  mediastinal]
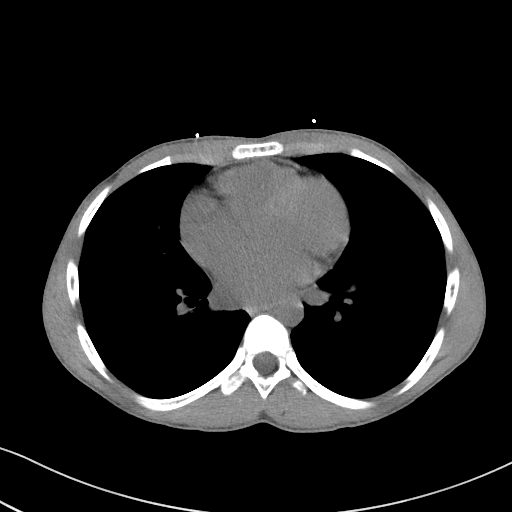
[im 93/124  bone]
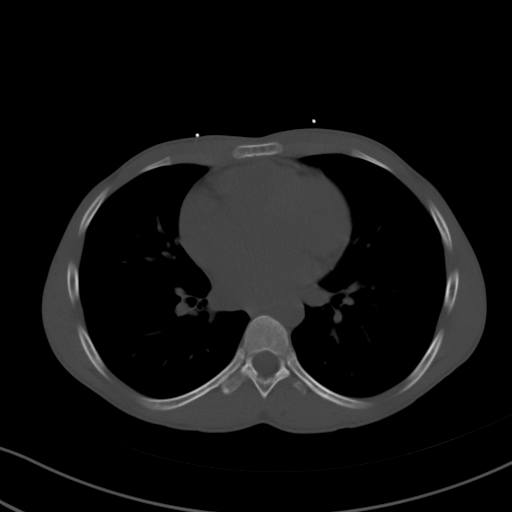
[im 103/124  mediastinal]
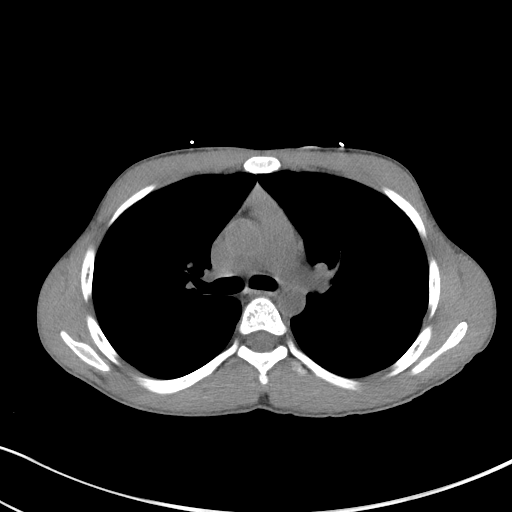
[im 113/124  mediastinal]
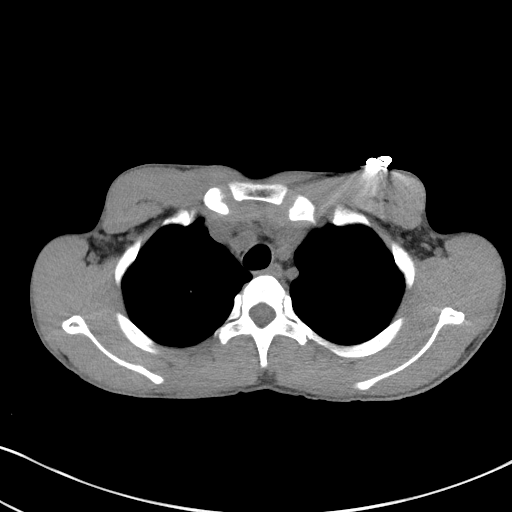

[Series 4: coronals · coronal · 0.87mm/px · 3 of 105 slices shown]
[im 21/105  mediastinal]
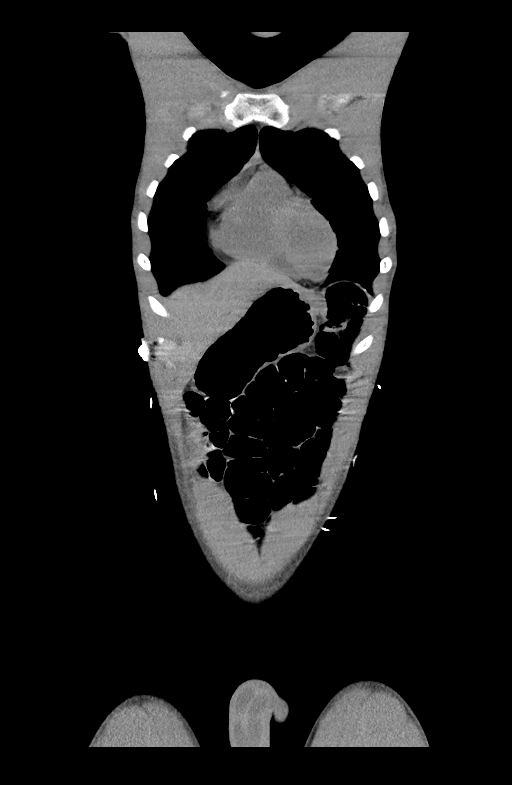
[im 42/105  mediastinal]
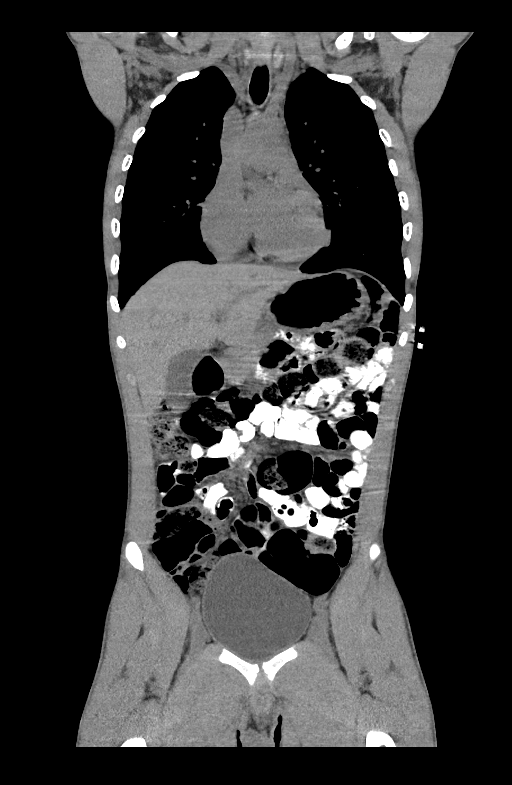
[im 63/105  mediastinal]
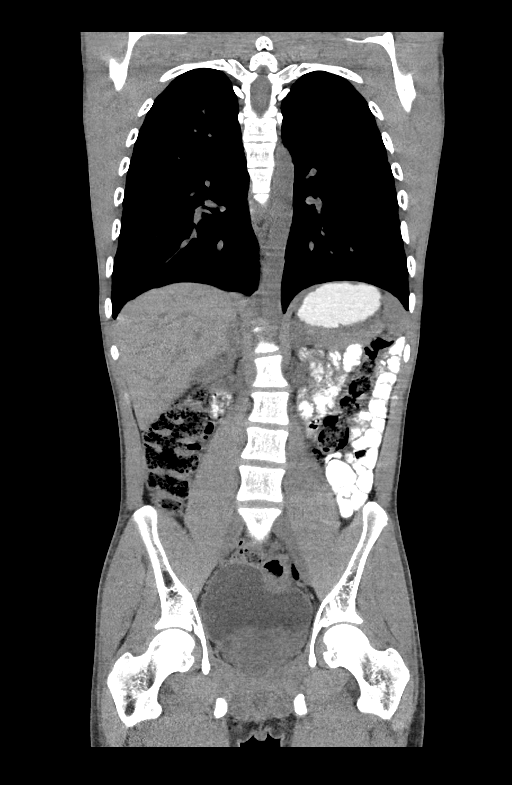

[14 of 36 positions shown; findings below may reference images not displayed]

FINDINGS: CT CHEST FINDINGS

Cardiovascular: No significant vascular findings on noncontrast
imaging. Decreased density of the blood pool consistent with anemia.
The heart size is normal. There is no pericardial effusion.

Mediastinum/Nodes: There are no enlarged mediastinal, hilar or
axillary lymph nodes. Hilar assessment is limited by the lack of
intravenous contrast. Residual thymic tissue in the anterior
mediastinum, typical for age. The thyroid gland, trachea and
esophagus demonstrate no significant findings.

Lungs/Pleura: There is no pleural effusion. 4 mm ground-glass nodule
in the left lower lobe on image 106/6, likely a lymph node or focus
of inflammation. Per consensus guidelines, this requires no specific
follow-up. The lungs are otherwise clear.

Musculoskeletal/Chest wall: No chest wall mass or suspicious osseous
findings.

CT ABDOMEN AND PELVIS FINDINGS

Hepatobiliary: The liver appears unremarkable as imaged in the
noncontrast state. No evidence of gallstones, gallbladder wall
thickening or biliary dilatation.

Pancreas: Unremarkable. No pancreatic ductal dilatation or
surrounding inflammatory changes.

Spleen: Normal in size without focal abnormality.

Adrenals/Urinary Tract: Both adrenal glands appear normal. Both
kidneys appear unremarkable on noncontrast imaging. No evidence of
urinary tract calculus or hydronephrosis. The bladder appears
normal.

Stomach/Bowel: Enteric contrast was administered and has passed into
the mid to distal small bowel. The stomach appears unremarkable for
its degree of distension. No evidence of bowel wall thickening,
distention or surrounding inflammatory change. The appendix appears
normal. Moderate stool throughout the colon.

Vascular/Lymphatic: There are no enlarged abdominal or pelvic lymph
nodes. No significant vascular findings on noncontrast imaging.

Reproductive: The prostate gland and seminal vesicles appear
unremarkable.

Other: No ascites, free air or focal extraluminal fluid collection.

Musculoskeletal: No acute or significant osseous findings.
IMPRESSION: 1. Low-density blood pool consistent with known anemia, etiology
undetermined. No splenomegaly.
2. No evidence of neoplasm or acute findings in the chest, abdomen
or pelvis.

## 2022-10-28 ENCOUNTER — Encounter: Payer: Self-pay | Admitting: Hematology & Oncology

## 2022-10-28 ENCOUNTER — Other Ambulatory Visit: Payer: Self-pay

## 2022-10-28 ENCOUNTER — Inpatient Hospital Stay: Payer: 59 | Admitting: Hematology & Oncology

## 2022-10-28 ENCOUNTER — Inpatient Hospital Stay: Payer: 59 | Attending: Hematology & Oncology

## 2022-10-28 VITALS — BP 101/52 | HR 53 | Temp 98.6°F | Resp 18 | Ht 64.0 in | Wt 114.0 lb

## 2022-10-28 DIAGNOSIS — E230 Hypopituitarism: Secondary | ICD-10-CM | POA: Diagnosis not present

## 2022-10-28 DIAGNOSIS — D649 Anemia, unspecified: Secondary | ICD-10-CM | POA: Insufficient documentation

## 2022-10-28 DIAGNOSIS — Q2112 Patent foramen ovale: Secondary | ICD-10-CM | POA: Diagnosis not present

## 2022-10-28 LAB — CMP (CANCER CENTER ONLY)
ALT: 12 U/L (ref 0–44)
AST: 19 U/L (ref 15–41)
Albumin: 4.9 g/dL (ref 3.5–5.0)
Alkaline Phosphatase: 51 U/L (ref 38–126)
Anion gap: 7 (ref 5–15)
BUN: 10 mg/dL (ref 6–20)
CO2: 28 mmol/L (ref 22–32)
Calcium: 10.1 mg/dL (ref 8.9–10.3)
Chloride: 103 mmol/L (ref 98–111)
Creatinine: 0.86 mg/dL (ref 0.61–1.24)
GFR, Estimated: >60 mL/min (ref >60)
Glucose, Bld: 89 mg/dL (ref 70–99)
Potassium: 4.3 mmol/L (ref 3.5–5.1)
Sodium: 138 mmol/L (ref 135–145)
Total Bilirubin: 0.3 mg/dL (ref 0.3–1.2)
Total Protein: 7.7 g/dL (ref 6.5–8.1)

## 2022-10-28 LAB — RETICULOCYTES
Immature Retic Fract: 7.1 % (ref 2.3–15.9)
RBC.: 5.99 MIL/uL — ABNORMAL HIGH (ref 4.22–5.81)
Retic Count, Absolute: 40.7 10*3/uL (ref 19.0–186.0)
Retic Ct Pct: 0.7 % (ref 0.4–3.1)

## 2022-10-28 LAB — CBC WITH DIFFERENTIAL (CANCER CENTER ONLY)
Abs Immature Granulocytes: 0.01 10*3/uL (ref 0.00–0.07)
Basophils Absolute: 0 10*3/uL (ref 0.0–0.1)
Basophils Relative: 1 %
Eosinophils Absolute: 0.3 10*3/uL (ref 0.0–0.5)
Eosinophils Relative: 8 %
HCT: 45 % (ref 39.0–52.0)
Hemoglobin: 14.4 g/dL (ref 13.0–17.0)
Immature Granulocytes: 0 %
Lymphocytes Relative: 38 %
Lymphs Abs: 1.6 10*3/uL (ref 0.7–4.0)
MCH: 23.7 pg — ABNORMAL LOW (ref 26.0–34.0)
MCHC: 32 g/dL (ref 30.0–36.0)
MCV: 74 fL — ABNORMAL LOW (ref 80.0–100.0)
Monocytes Absolute: 0.5 10*3/uL (ref 0.1–1.0)
Monocytes Relative: 11 %
Neutro Abs: 1.8 10*3/uL (ref 1.7–7.7)
Neutrophils Relative %: 42 %
Platelet Count: 208 10*3/uL (ref 150–400)
RBC: 6.08 MIL/uL — ABNORMAL HIGH (ref 4.22–5.81)
RDW: 13.5 % (ref 11.5–15.5)
WBC Count: 4.2 10*3/uL (ref 4.0–10.5)
nRBC: 0 % (ref 0.0–0.2)

## 2022-10-28 LAB — FERRITIN: Ferritin: 68 ng/mL (ref 24–336)

## 2022-10-28 NOTE — Progress Notes (Signed)
Hematology and Oncology Follow Up Visit  Sean [name] [id number] 2001-09-12 21 y.o. 10/28/2022   Principle Diagnosis:  Multifactorial anemia-likely malnutrition/iron deficiency  Current Therapy:   Oral Iron -- Fusion Plus I po q day Folic acid 2 mg p.o. daily     Interim History:  Sean [name] is back for follow-up.  He looks great.  He is in school.  He just finished up finals in college.  He is still working at a [employer].  He is quite busy.  He is eating well.  He is taking the oral iron and the folic acid.  I think this is helping him quite a bit.  He has had no problems with bleeding or bruising.  There is no cough or shortness of breath.  He has had no rashes.  He has had no bony pain.  Last time that we saw him back in August, his ferritin was 74 with an iron saturation of 30%.  He has had no fever.  He has had no headache.  There is been no mouth sores.  He has had no change in bowel or bladder habits.  There is been no problems with diarrhea.  Overall, I would say his performance status is probably ECOG 0.   Medications:  Current Outpatient Medications:    acetaminophen (TYLENOL) 500 MG tablet, Take 500 mg by mouth every 6 (six) hours as needed., Disp: , Rfl:    albuterol (ACCUNEB) 0.63 MG/3ML nebulizer solution, Take 1 ampule by nebulization as needed. Reported on 03/31/2016, Disp: , Rfl:    albuterol (PROVENTIL HFA;VENTOLIN HFA) 108 (90 Base) MCG/ACT inhaler, Inhale into the lungs every 6 (six) hours as needed for wheezing or shortness of breath. Reported on 03/31/2016, Disp: , Rfl:    folic acid (FOLVITE) 1 MG tablet, Take 2 tablets (2 mg total) by mouth daily., Disp: 60 tablet, Rfl: 6   Iron-FA-B Cmp-C-Biot-Probiotic (FUSION PLUS) CAPS, Take 1 tablet by mouth daily after breakfast., Disp: 30 capsule, Rfl: 5   mupirocin ointment (BACTROBAN) 2 %, Apply topically 2 (two) times daily. Apply to your toe TWICE a day, Disp: 30 g, Rfl: 1  Allergies: No Known  Allergies  Past Medical History, Surgical history, Social history, and Family History were reviewed and updated.  Review of Systems: Review of Systems  Constitutional:  Positive for appetite change.  HENT:  Negative.    Eyes: Negative.   Respiratory: Negative.    Cardiovascular: Negative.   Gastrointestinal: Negative.   Endocrine: Negative.   Genitourinary: Negative.    Musculoskeletal: Negative.   Skin: Negative.   Neurological: Negative.   Hematological: Negative.   Psychiatric/Behavioral: Negative.      Physical Exam:  height is 5\' 4"  (1.626 m) and weight is 114 lb 0.6 oz (51.7 kg). His oral temperature is 98.6 F (37 C). His blood pressure is 101/52 (abnormal) and his pulse is 53 (abnormal). His respiration is 18 and oxygen saturation is 100%.   Wt Readings from Last 3 Encounters:  10/28/22 114 lb 0.6 oz (51.7 kg)  06/23/22 113 lb (51.3 kg)  03/10/22 114 lb (51.7 kg)    Physical Exam Vitals reviewed.  HENT:     Head: Normocephalic and atraumatic.  Eyes:     Pupils: Pupils are equal, round, and reactive to light.  Cardiovascular:     Rate and Rhythm: Normal rate and regular rhythm.     Heart sounds: Normal heart sounds.  Pulmonary:     Effort: Pulmonary effort  is normal.     Breath sounds: Normal breath sounds.  Abdominal:     General: Bowel sounds are normal.     Palpations: Abdomen is soft.  Musculoskeletal:        General: No tenderness or deformity. Normal range of motion.     Cervical back: Normal range of motion.  Lymphadenopathy:     Cervical: No cervical adenopathy.  Skin:    General: Skin is warm and dry.     Findings: No erythema or rash.  Neurological:     Mental Status: He is alert and oriented to person, place, and time.  Psychiatric:        Behavior: Behavior normal.        Thought Content: Thought content normal.        Judgment: Judgment normal.      Lab Results  Component Value Date   WBC 4.2 10/28/2022   HGB 14.4 10/28/2022    HCT 45.0 10/28/2022   MCV 74.0 (L) 10/28/2022   PLT 208 10/28/2022     Chemistry      Component Value Date/Time   NA 138 06/23/2022 1432   K 4.3 06/23/2022 1432   CL 103 06/23/2022 1432   CO2 26 06/23/2022 1432   BUN 12 06/23/2022 1432   CREATININE 1.11 06/23/2022 1432      Component Value Date/Time   CALCIUM 10.3 06/23/2022 1432   ALKPHOS 53 06/23/2022 1432   AST 15 06/23/2022 1432   ALT 9 06/23/2022 1432   BILITOT 0.4 06/23/2022 1432      Impression and Plan: Sean [name] is a very nice 21 year old African-American male.  He has a growth hormone deficiency.  He has not had any growth hormone replacement for a couple years.  He is on oral iron.  He is on folic acid.  He says he is taking both.  Again, I really think he is doing nicely.  His hemoglobin keeps going up.  As such, I do not think we have to make any changes with the oral iron or the oral folic acid.  We will plan to get him back to see [place] in 6 months.  I would like to get him back after he finishes up the second semester of school.  I think that as long as he eats well, his blood count should be maintained.   Sean Macho, MD 12/14/20233:18 PM

## 2022-10-29 LAB — IRON AND IRON BINDING CAPACITY (CC-WL,HP ONLY)
Iron: 117 ug/dL (ref 45–182)
Saturation Ratios: 40 % — ABNORMAL HIGH (ref 17.9–39.5)
TIBC: 294 ug/dL (ref 250–450)
UIBC: 177 ug/dL (ref 117–376)

## 2022-11-01 LAB — HGB FRACTIONATION CASCADE
Hgb A2: 2.4 % (ref 1.8–3.2)
Hgb A: 97.6 % (ref 96.4–98.8)
Hgb F: 0 % (ref 0.0–2.0)
Hgb S: 0 %

## 2022-12-17 ENCOUNTER — Other Ambulatory Visit: Payer: Self-pay | Admitting: Hematology & Oncology

## 2023-06-28 ENCOUNTER — Other Ambulatory Visit: Payer: Self-pay | Admitting: Hematology & Oncology

## 2023-07-06 ENCOUNTER — Other Ambulatory Visit: Payer: Self-pay | Admitting: Hematology & Oncology

## 2023-11-11 ENCOUNTER — Other Ambulatory Visit: Payer: Self-pay

## 2023-11-11 DIAGNOSIS — D649 Anemia, unspecified: Secondary | ICD-10-CM

## 2023-11-14 ENCOUNTER — Encounter: Payer: Self-pay | Admitting: Hematology & Oncology

## 2023-11-14 ENCOUNTER — Other Ambulatory Visit: Payer: Self-pay

## 2023-11-14 ENCOUNTER — Inpatient Hospital Stay: Payer: 59 | Attending: Hematology & Oncology

## 2023-11-14 ENCOUNTER — Inpatient Hospital Stay (HOSPITAL_BASED_OUTPATIENT_CLINIC_OR_DEPARTMENT_OTHER): Payer: 59 | Admitting: Hematology & Oncology

## 2023-11-14 VITALS — BP 116/58 | HR 51 | Temp 98.5°F | Resp 18 | Ht 64.0 in | Wt 113.0 lb

## 2023-11-14 DIAGNOSIS — D509 Iron deficiency anemia, unspecified: Secondary | ICD-10-CM | POA: Diagnosis not present

## 2023-11-14 DIAGNOSIS — D649 Anemia, unspecified: Secondary | ICD-10-CM | POA: Insufficient documentation

## 2023-11-14 DIAGNOSIS — E230 Hypopituitarism: Secondary | ICD-10-CM | POA: Insufficient documentation

## 2023-11-14 LAB — CBC WITH DIFFERENTIAL (CANCER CENTER ONLY)
Abs Immature Granulocytes: 0.01 10*3/uL (ref 0.00–0.07)
Basophils Absolute: 0.1 10*3/uL (ref 0.0–0.1)
Basophils Relative: 1 %
Eosinophils Absolute: 0.3 10*3/uL (ref 0.0–0.5)
Eosinophils Relative: 7 %
HCT: 42.4 % (ref 39.0–52.0)
Hemoglobin: 13.6 g/dL (ref 13.0–17.0)
Immature Granulocytes: 0 %
Lymphocytes Relative: 39 %
Lymphs Abs: 1.5 10*3/uL (ref 0.7–4.0)
MCH: 23.9 pg — ABNORMAL LOW (ref 26.0–34.0)
MCHC: 32.1 g/dL (ref 30.0–36.0)
MCV: 74.4 fL — ABNORMAL LOW (ref 80.0–100.0)
Monocytes Absolute: 0.4 10*3/uL (ref 0.1–1.0)
Monocytes Relative: 11 %
Neutro Abs: 1.6 10*3/uL — ABNORMAL LOW (ref 1.7–7.7)
Neutrophils Relative %: 42 %
Platelet Count: 208 10*3/uL (ref 150–400)
RBC: 5.7 MIL/uL (ref 4.22–5.81)
RDW: 14.6 % (ref 11.5–15.5)
WBC Count: 3.8 10*3/uL — ABNORMAL LOW (ref 4.0–10.5)
nRBC: 0 % (ref 0.0–0.2)

## 2023-11-14 LAB — RETICULOCYTES
Immature Retic Fract: 10.8 % (ref 2.3–15.9)
RBC.: 5.63 MIL/uL (ref 4.22–5.81)
Retic Count, Absolute: 55.7 10*3/uL (ref 19.0–186.0)
Retic Ct Pct: 1 % (ref 0.4–3.1)

## 2023-11-14 LAB — CMP (CANCER CENTER ONLY)
ALT: 10 U/L (ref 0–44)
AST: 20 U/L (ref 15–41)
Albumin: 4.7 g/dL (ref 3.5–5.0)
Alkaline Phosphatase: 42 U/L (ref 38–126)
Anion gap: 10 (ref 5–15)
BUN: 10 mg/dL (ref 6–20)
CO2: 25 mmol/L (ref 22–32)
Calcium: 9.7 mg/dL (ref 8.9–10.3)
Chloride: 105 mmol/L (ref 98–111)
Creatinine: 0.92 mg/dL (ref 0.61–1.24)
GFR, Estimated: >60 mL/min (ref >60)
Glucose, Bld: 84 mg/dL (ref 70–99)
Potassium: 4.5 mmol/L (ref 3.5–5.1)
Sodium: 140 mmol/L (ref 135–145)
Total Bilirubin: 0.6 mg/dL (ref <1.2)
Total Protein: 7 g/dL (ref 6.5–8.1)

## 2023-11-14 LAB — IRON AND IRON BINDING CAPACITY (CC-WL,HP ONLY)
Iron: 208 ug/dL — ABNORMAL HIGH (ref 45–182)
Saturation Ratios: 73 % — ABNORMAL HIGH (ref 17.9–39.5)
TIBC: 286 ug/dL (ref 250–450)
UIBC: 78 ug/dL — ABNORMAL LOW (ref 117–376)

## 2023-11-14 LAB — FERRITIN: Ferritin: 141 ng/mL (ref 24–336)

## 2023-11-14 NOTE — Progress Notes (Signed)
Hematology and Oncology Follow Up Visit  Sean [name] [id number] 04-28-2001 22 y.o. 11/14/2023   Principle Diagnosis:  Multifactorial anemia-likely malnutrition/iron deficiency  Current Therapy:   Oral Iron -- Fusion Plus I po q day Folic acid 2 mg p.o. daily     Interim History:  Sean [name] is back for follow-up.  It has been a year since we last saw him.  He is doing quite well.  He is in school.  He will be going to [employer] next year.  He is still working.  He is eating well.  His weight is holding steady.  He has had no change in bowel or bladder habits.  He has had no cough or shortness of breath.  He has had no issues with COVID.  His last iron studies which were done a year ago showed a ferritin of 16 with iron saturation of 40%.  He is taking folic acid and oral iron.  Overall, I would say that his performance status is ECOG 0.   Medications:  Current Outpatient Medications:    acetaminophen (TYLENOL) 500 MG tablet, Take 500 mg by mouth every 6 (six) hours as needed., Disp: , Rfl:    albuterol (ACCUNEB) 0.63 MG/3ML nebulizer solution, Take 1 ampule by nebulization as needed. Reported on 03/31/2016, Disp: , Rfl:    albuterol (PROVENTIL HFA;VENTOLIN HFA) 108 (90 Base) MCG/ACT inhaler, Inhale into the lungs every 6 (six) hours as needed for wheezing or shortness of breath. Reported on 03/31/2016, Disp: , Rfl:    folic acid (FOLVITE) 1 MG tablet, Take 2 tablets (2 mg total) by mouth daily., Disp: 60 tablet, Rfl: 6   Iron-FA-B Cmp-C-Biot-Probiotic (FUSION PLUS) CAPS, Take 1 tablet by mouth daily after breakfast., Disp: 30 capsule, Rfl: 5   mupirocin ointment (BACTROBAN) 2 %, Apply topically 2 (two) times daily. Apply to your toe TWICE a day, Disp: 30 g, Rfl: 1  Allergies: No Known Allergies  Past Medical History, Surgical history, Social history, and Family History were reviewed and updated.  Review of Systems: Review of Systems  Constitutional:  Positive for appetite  change.  HENT:  Negative.    Eyes: Negative.   Respiratory: Negative.    Cardiovascular: Negative.   Gastrointestinal: Negative.   Endocrine: Negative.   Genitourinary: Negative.    Musculoskeletal: Negative.   Skin: Negative.   Neurological: Negative.   Hematological: Negative.   Psychiatric/Behavioral: Negative.      Physical Exam:  Temperature 98.5.  Pulse 51.  Blood pressure 160/58.  Weight is 113 pounds.  Wt Readings from Last 3 Encounters:  10/28/22 114 lb 0.6 oz (51.7 kg)  06/23/22 113 lb (51.3 kg)  03/10/22 114 lb (51.7 kg)    Physical Exam Vitals reviewed.  HENT:     Head: Normocephalic and atraumatic.  Eyes:     Pupils: Pupils are equal, round, and reactive to light.  Cardiovascular:     Rate and Rhythm: Normal rate and regular rhythm.     Heart sounds: Normal heart sounds.  Pulmonary:     Effort: Pulmonary effort is normal.     Breath sounds: Normal breath sounds.  Abdominal:     General: Bowel sounds are normal.     Palpations: Abdomen is soft.  Musculoskeletal:        General: No tenderness or deformity. Normal range of motion.     Cervical back: Normal range of motion.  Lymphadenopathy:     Cervical: No cervical adenopathy.  Skin:  General: Skin is warm and dry.     Findings: No erythema or rash.  Neurological:     Mental Status: He is alert and oriented to person, place, and time.  Psychiatric:        Behavior: Behavior normal.        Thought Content: Thought content normal.        Judgment: Judgment normal.      Lab Results  Component Value Date   WBC 4.2 10/28/2022   HGB 14.4 10/28/2022   HCT 45.0 10/28/2022   MCV 74.0 (L) 10/28/2022   PLT 208 10/28/2022     Chemistry      Component Value Date/Time   NA 138 10/28/2022 1448   K 4.3 10/28/2022 1448   CL 103 10/28/2022 1448   CO2 28 10/28/2022 1448   BUN 10 10/28/2022 1448   CREATININE 0.86 10/28/2022 1448      Component Value Date/Time   CALCIUM 10.1 10/28/2022 1448    ALKPHOS 51 10/28/2022 1448   AST 19 10/28/2022 1448   ALT 12 10/28/2022 1448   BILITOT 0.3 10/28/2022 1448      Impression and Plan: Sean [name] is a very nice 22 year old African-American male.  He has a growth hormone deficiency.  He has not had any growth hormone replacement for a couple years.  He is on oral iron.  He is on folic acid.  He says he is taking both.  His blood counts look great.  Everything is stable.  I am not too worried about the leukopenia.  I am not surprised given that he is African-American.  He has had no risk for increasing infection.  Will still follow him up.  Will get him back in 1 more year.  I think that everything looks stable in 1 year, then we can probably let him go from the Clinic.   Josph Macho, MD 12/30/20248:13 AM

## 2024-01-05 ENCOUNTER — Other Ambulatory Visit: Payer: Self-pay | Admitting: Hematology & Oncology

## 2024-02-21 ENCOUNTER — Encounter (INDEPENDENT_AMBULATORY_CARE_PROVIDER_SITE_OTHER): Payer: Self-pay

## 2024-03-05 ENCOUNTER — Encounter (INDEPENDENT_AMBULATORY_CARE_PROVIDER_SITE_OTHER): Payer: Self-pay

## 2024-07-06 ENCOUNTER — Other Ambulatory Visit: Payer: Self-pay | Admitting: Hematology & Oncology

## 2024-07-19 ENCOUNTER — Other Ambulatory Visit: Payer: Self-pay | Admitting: Hematology & Oncology

## 2024-11-13 ENCOUNTER — Inpatient Hospital Stay: Payer: 59 | Attending: Hematology & Oncology

## 2024-11-13 ENCOUNTER — Ambulatory Visit: Payer: Self-pay | Admitting: Hematology & Oncology

## 2024-11-13 ENCOUNTER — Encounter: Payer: Self-pay | Admitting: Hematology & Oncology

## 2024-11-13 ENCOUNTER — Inpatient Hospital Stay: Payer: 59 | Admitting: Hematology & Oncology

## 2024-11-13 ENCOUNTER — Other Ambulatory Visit: Payer: Self-pay

## 2024-11-13 VITALS — BP 116/56 | HR 74 | Temp 97.9°F | Resp 16 | Ht 64.0 in | Wt 116.0 lb

## 2024-11-13 DIAGNOSIS — D72819 Decreased white blood cell count, unspecified: Secondary | ICD-10-CM | POA: Diagnosis not present

## 2024-11-13 DIAGNOSIS — D649 Anemia, unspecified: Secondary | ICD-10-CM

## 2024-11-13 DIAGNOSIS — D509 Iron deficiency anemia, unspecified: Secondary | ICD-10-CM

## 2024-11-13 DIAGNOSIS — E230 Hypopituitarism: Secondary | ICD-10-CM | POA: Insufficient documentation

## 2024-11-13 LAB — CBC WITH DIFFERENTIAL (CANCER CENTER ONLY)
Abs Immature Granulocytes: 0.01 K/uL (ref 0.00–0.07)
Basophils Absolute: 0 K/uL (ref 0.0–0.1)
Basophils Relative: 1 %
Eosinophils Absolute: 0.3 K/uL (ref 0.0–0.5)
Eosinophils Relative: 7 %
HCT: 46.2 % (ref 39.0–52.0)
Hemoglobin: 14.6 g/dL (ref 13.0–17.0)
Immature Granulocytes: 0 %
Lymphocytes Relative: 38 %
Lymphs Abs: 1.4 K/uL (ref 0.7–4.0)
MCH: 22.5 pg — ABNORMAL LOW (ref 26.0–34.0)
MCHC: 31.6 g/dL (ref 30.0–36.0)
MCV: 71.3 fL — ABNORMAL LOW (ref 80.0–100.0)
Monocytes Absolute: 0.3 K/uL (ref 0.1–1.0)
Monocytes Relative: 9 %
Neutro Abs: 1.7 K/uL (ref 1.7–7.7)
Neutrophils Relative %: 45 %
Platelet Count: 250 K/uL (ref 150–400)
RBC: 6.48 MIL/uL — ABNORMAL HIGH (ref 4.22–5.81)
RDW: 14.6 % (ref 11.5–15.5)
WBC Count: 3.8 K/uL — ABNORMAL LOW (ref 4.0–10.5)
nRBC: 0 % (ref 0.0–0.2)

## 2024-11-13 LAB — RETICULOCYTES
Immature Retic Fract: 6.1 % (ref 2.3–15.9)
RBC.: 6.48 MIL/uL — ABNORMAL HIGH (ref 4.22–5.81)
Retic Count, Absolute: 26.6 K/uL (ref 19.0–186.0)
Retic Ct Pct: 0.4 % (ref 0.4–3.1)

## 2024-11-13 LAB — IRON AND IRON BINDING CAPACITY (CC-WL,HP ONLY)
Iron: 81 ug/dL (ref 45–182)
Saturation Ratios: 25 % (ref 17.9–39.5)
TIBC: 330 ug/dL (ref 250–450)
UIBC: 249 ug/dL

## 2024-11-13 LAB — CMP (CANCER CENTER ONLY)
ALT: 14 U/L (ref 0–44)
AST: 24 U/L (ref 15–41)
Albumin: 5 g/dL (ref 3.5–5.0)
Alkaline Phosphatase: 65 U/L (ref 38–126)
Anion gap: 13 (ref 5–15)
BUN: 8 mg/dL (ref 6–20)
CO2: 24 mmol/L (ref 22–32)
Calcium: 10 mg/dL (ref 8.9–10.3)
Chloride: 103 mmol/L (ref 98–111)
Creatinine: 0.97 mg/dL (ref 0.61–1.24)
GFR, Estimated: >60 mL/min
Glucose, Bld: 90 mg/dL (ref 70–99)
Potassium: 4 mmol/L (ref 3.5–5.1)
Sodium: 140 mmol/L (ref 135–145)
Total Bilirubin: 0.3 mg/dL (ref 0.0–1.2)
Total Protein: 8.3 g/dL — ABNORMAL HIGH (ref 6.5–8.1)

## 2024-11-13 LAB — FERRITIN: Ferritin: 49 ng/mL (ref 24–336)

## 2024-11-13 NOTE — Progress Notes (Signed)
 " Hematology and Oncology Follow Up Visit  Sean [name] [id number] 23-Oct-2001 23 y.o. 11/13/2024   Principle Diagnosis:  Multifactorial anemia-likely malnutrition/iron deficiency  Current Therapy:   Oral Iron -- Fusion Plus I po q day -DC on 11/13/2024 Folic acid  2 mg p.o. daily     Interim History:  Sean [name] is back for follow-up.  We see him every year.  Sean [name] is now in school at [employer].  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  Sean [name] is doing quite well.  Sean [name] is done well with respect to eating.  His weight is going up a little bit.  Sean [name] has had no problems with bleeding.  Sean [name] has had no fever.  Sean [name] has had no nausea or vomiting.  Has been no change in bowel or bladder habits.  Continues on folic acid  and iron.  I really do not think Sean [name] needs any more iron.  When we checked his iron studies a year ago, his iron saturation was 73%.  Sean [name] has had no problems with cough or shortness of breath.  Thankfully, there is been no problems with COVID.  Overall, I would say that his performance status is probably ECOG 1.   Medications:  Current Outpatient Medications:    acetaminophen  (TYLENOL ) 500 MG tablet, Take 500 mg by mouth every 6 (six) hours as needed., Disp: , Rfl:    albuterol  (ACCUNEB ) 0.63 MG/3ML nebulizer solution, Take 1 ampule by nebulization as needed. Reported on 03/31/2016, Disp: , Rfl:    albuterol  (PROVENTIL  HFA;VENTOLIN  HFA) 108 (90 Base) MCG/ACT inhaler, Inhale into the lungs every 6 (six) hours as needed for wheezing or shortness of breath. Reported on 03/31/2016, Disp: , Rfl:    folic acid  (FOLVITE ) 1 MG tablet, Take 2 tablets (2 mg total) by mouth daily., Disp: 60 tablet, Rfl: 6   Iron-FA-B Cmp-C-Biot-Probiotic (FUSION PLUS) CAPS, TAKE ONE CAPSULE AFTER breakfast, Disp: 30 capsule, Rfl: 5   mupirocin  ointment (BACTROBAN ) 2 %, Apply topically 2 (two) times daily. Apply to your toe TWICE a day, Disp: 30 g, Rfl: 1  Allergies: No Known Allergies  Past Medical History,  Surgical history, Social history, and Family History were reviewed and updated.  Review of Systems: Review of Systems  Constitutional:  Positive for appetite change.  HENT:  Negative.    Eyes: Negative.   Respiratory: Negative.    Cardiovascular: Negative.   Gastrointestinal: Negative.   Endocrine: Negative.   Genitourinary: Negative.    Musculoskeletal: Negative.   Skin: Negative.   Neurological: Negative.   Hematological: Negative.   Psychiatric/Behavioral: Negative.      Physical Exam:  Temperature 98.5.  Pulse 51.  Blood pressure 160/58.  Weight is 116 pounds.  Wt Readings from Last 3 Encounters:  11/13/24 116 lb (52.6 kg)  11/14/23 113 lb (51.3 kg)  10/28/22 114 lb 0.6 oz (51.7 kg)    Physical Exam Vitals reviewed.  HENT:     Head: Normocephalic and atraumatic.  Eyes:     Pupils: Pupils are equal, round, and reactive to light.  Cardiovascular:     Rate and Rhythm: Normal rate and regular rhythm.     Heart sounds: Normal heart sounds.  Pulmonary:     Effort: Pulmonary effort is normal.     Breath sounds: Normal breath sounds.  Abdominal:     General: Bowel sounds are normal.     Palpations: Abdomen is soft.  Musculoskeletal:        General: No tenderness or deformity. Normal  range of motion.     Cervical back: Normal range of motion.  Lymphadenopathy:     Cervical: No cervical adenopathy.  Skin:    General: Skin is warm and dry.     Findings: No erythema or rash.  Neurological:     Mental Status: Sean [name] is alert and oriented to person, place, and time.  Psychiatric:        Behavior: Behavior normal.        Thought Content: Thought content normal.        Judgment: Judgment normal.      Lab Results  Component Value Date   WBC 3.8 (L) 11/13/2024   HGB 14.6 11/13/2024   HCT 46.2 11/13/2024   MCV 71.3 (L) 11/13/2024   PLT 250 11/13/2024     Chemistry      Component Value Date/Time   NA 140 11/13/2024 0806   K 4.0 11/13/2024 0806   CL 103  11/13/2024 0806   CO2 24 11/13/2024 0806   BUN 8 11/13/2024 0806   CREATININE 0.97 11/13/2024 0806      Component Value Date/Time   CALCIUM 10.0 11/13/2024 0806   ALKPHOS 65 11/13/2024 0806   AST 24 11/13/2024 0806   ALT 14 11/13/2024 0806   BILITOT 0.3 11/13/2024 0806      Impression and Plan: Sean [name] is a very nice 23 year old African-American male.  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  Sean [name] has not had any growth hormone replacement for a couple years.  Sean [name] is on oral iron.  Sean [name] is on folic acid .  Sean [name] says Sean [name] is taking both.  Again, we will stop the oral iron.  I really do not think that Sean [name] needs this.  I do think the folic acid  will be helpful.  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  I think this is more related to his ethnicity.  We will still plan to get him back in 1 year.  I think if everything looks good in 1 year, then we can probably let him go from the practice.  I would feel confident that Sean [name] would maintain his hemoglobin and that his weight will continue to improve.   Maude JONELLE Crease, MD 12/30/20258:48 AM  "

## 2024-11-14 NOTE — Telephone Encounter (Signed)
 Advised via MyChart.

## 2024-11-14 NOTE — Telephone Encounter (Signed)
-----   Message from Maude Crease, MD sent at 11/13/2024  4:50 PM EST ----- Call - tell him that the iron level is ok!!   Tell him to study hard in school!!  [place]

## 2025-11-13 ENCOUNTER — Inpatient Hospital Stay: Admitting: Hematology & Oncology

## 2025-11-13 ENCOUNTER — Inpatient Hospital Stay
